# Patient Record
Sex: Female | Born: 1965 | Race: Black or African American | Hispanic: No | Marital: Single | Smoking: Never smoker
Health system: Southern US, Community
[De-identification: ages and names within clinical notes are randomized; demographics above are authoritative.]

## PROBLEM LIST (undated history)

## (undated) DIAGNOSIS — I10 Essential (primary) hypertension: Secondary | ICD-10-CM

## (undated) DIAGNOSIS — E785 Hyperlipidemia, unspecified: Secondary | ICD-10-CM

## (undated) DIAGNOSIS — D571 Sickle-cell disease without crisis: Secondary | ICD-10-CM

## (undated) DIAGNOSIS — D573 Sickle-cell trait: Secondary | ICD-10-CM

## (undated) DIAGNOSIS — G43909 Migraine, unspecified, not intractable, without status migrainosus: Secondary | ICD-10-CM

## (undated) DIAGNOSIS — D219 Benign neoplasm of connective and other soft tissue, unspecified: Secondary | ICD-10-CM

## (undated) DIAGNOSIS — M069 Rheumatoid arthritis, unspecified: Secondary | ICD-10-CM

## (undated) DIAGNOSIS — J40 Bronchitis, not specified as acute or chronic: Secondary | ICD-10-CM

## (undated) HISTORY — PX: PARTIAL HYSTERECTOMY: SHX80

## (undated) HISTORY — DX: Hyperlipidemia, unspecified: E78.5

## (undated) HISTORY — DX: Benign neoplasm of connective and other soft tissue, unspecified: D21.9

## (undated) HISTORY — DX: Sickle-cell disease without crisis: D57.1

## (undated) HISTORY — PX: ABDOMINAL HYSTERECTOMY: SHX81

## (undated) HISTORY — PX: BUNIONECTOMY: SHX129

## (undated) HISTORY — DX: Bronchitis, not specified as acute or chronic: J40

---

## 2000-11-24 HISTORY — PX: PARTIAL HYSTERECTOMY: SHX80

## 2020-10-09 DIAGNOSIS — M0609 Rheumatoid arthritis without rheumatoid factor, multiple sites: Secondary | ICD-10-CM | POA: Insufficient documentation

## 2021-04-18 ENCOUNTER — Other Ambulatory Visit: Payer: Self-pay

## 2021-04-18 ENCOUNTER — Ambulatory Visit (INDEPENDENT_AMBULATORY_CARE_PROVIDER_SITE_OTHER): Payer: 59

## 2021-04-18 ENCOUNTER — Ambulatory Visit (HOSPITAL_COMMUNITY)
Admission: RE | Admit: 2021-04-18 | Discharge: 2021-04-18 | Disposition: A | Discharge status: Home / Self Care | Payer: 59 | Source: Ambulatory Visit | Attending: Physician Assistant | Admitting: Physician Assistant

## 2021-04-18 ENCOUNTER — Encounter (HOSPITAL_COMMUNITY): Payer: Self-pay

## 2021-04-18 VITALS — BP 114/73 | HR 101 | Temp 101.5°F | Resp 18

## 2021-04-18 DIAGNOSIS — J069 Acute upper respiratory infection, unspecified: Secondary | ICD-10-CM | POA: Diagnosis present

## 2021-04-18 DIAGNOSIS — R509 Fever, unspecified: Secondary | ICD-10-CM | POA: Diagnosis not present

## 2021-04-18 DIAGNOSIS — R059 Cough, unspecified: Secondary | ICD-10-CM | POA: Diagnosis present

## 2021-04-18 DIAGNOSIS — J9801 Acute bronchospasm: Secondary | ICD-10-CM | POA: Diagnosis not present

## 2021-04-18 HISTORY — DX: Migraine, unspecified, not intractable, without status migrainosus: G43.909

## 2021-04-18 HISTORY — DX: Rheumatoid arthritis, unspecified: M06.9

## 2021-04-18 HISTORY — DX: Sickle-cell trait: D57.3

## 2021-04-18 HISTORY — DX: Essential (primary) hypertension: I10

## 2021-04-18 LAB — CBC WITH DIFFERENTIAL/PLATELET
Abs Immature Granulocytes: 0.01 10*3/uL (ref 0.00–0.07)
Basophils Absolute: 0 10*3/uL (ref 0.0–0.1)
Basophils Relative: 0 %
Eosinophils Absolute: 0 10*3/uL (ref 0.0–0.5)
Eosinophils Relative: 0 %
HCT: 38.6 % (ref 36.0–46.0)
Hemoglobin: 12.8 g/dL (ref 12.0–15.0)
Immature Granulocytes: 0 %
Lymphocytes Relative: 19 %
Lymphs Abs: 1.3 10*3/uL (ref 0.7–4.0)
MCH: 28.9 pg (ref 26.0–34.0)
MCHC: 33.2 g/dL (ref 30.0–36.0)
MCV: 87.1 fL (ref 80.0–100.0)
Monocytes Absolute: 0.9 10*3/uL (ref 0.1–1.0)
Monocytes Relative: 13 %
Neutro Abs: 4.8 10*3/uL (ref 1.7–7.7)
Neutrophils Relative %: 68 %
Platelets: 228 10*3/uL (ref 150–400)
RBC: 4.43 MIL/uL (ref 3.87–5.11)
RDW: 12.4 % (ref 11.5–15.5)
WBC: 7.1 10*3/uL (ref 4.0–10.5)
nRBC: 0 % (ref 0.0–0.2)

## 2021-04-18 LAB — COMPREHENSIVE METABOLIC PANEL
ALT: 24 U/L (ref 0–44)
AST: 24 U/L (ref 15–41)
Albumin: 3.5 g/dL (ref 3.5–5.0)
Alkaline Phosphatase: 74 U/L (ref 38–126)
Anion gap: 8 (ref 5–15)
BUN: 6 mg/dL (ref 6–20)
CO2: 26 mmol/L (ref 22–32)
Calcium: 8.6 mg/dL — ABNORMAL LOW (ref 8.9–10.3)
Chloride: 101 mmol/L (ref 98–111)
Creatinine, Ser: 0.73 mg/dL (ref 0.44–1.00)
GFR, Estimated: >60 mL/min (ref >60)
Glucose, Bld: 88 mg/dL (ref 70–99)
Potassium: 3.8 mmol/L (ref 3.5–5.1)
Sodium: 135 mmol/L (ref 135–145)
Total Bilirubin: 0.7 mg/dL (ref 0.3–1.2)
Total Protein: 7.7 g/dL (ref 6.5–8.1)

## 2021-04-18 MED ORDER — ACETAMINOPHEN 325 MG PO TABS
ORAL_TABLET | ORAL | Status: AC
  Filled 2021-04-18: qty 3

## 2021-04-18 MED ORDER — ALBUTEROL SULFATE HFA 108 (90 BASE) MCG/ACT IN AERS: 1.0000 | INHALATION_SPRAY | Freq: Four times a day (QID) | RESPIRATORY_TRACT | 0 refills | Status: DC | PRN

## 2021-04-18 MED ORDER — ACETAMINOPHEN 325 MG PO TABS
975.0000 mg | ORAL_TABLET | Freq: Once | ORAL | Status: AC
  Administered 2021-04-18: 975 mg via ORAL

## 2021-04-18 MED ORDER — PROMETHAZINE-DM 6.25-15 MG/5ML PO SYRP: 5.0000 mL | ORAL_SOLUTION | Freq: Two times a day (BID) | ORAL | 0 refills | Status: DC | PRN

## 2021-04-18 NOTE — ED Triage Notes (Signed)
Patient presents to [facility] for evaluation after being seen yesterday at another [facility] and opting not to see the provider.  C/o 2 days of fever, headache, cough, nasal congestion, and diarrhea.  States she was negative for flu, still waiting for COVID, but concerned she might have pnuemonia developing

## 2021-04-18 NOTE — Discharge Instructions (Addendum)
You can use Promethazine DM twice daily for cough.  This will make you sleepy so do not drive or drink alcohol with it.  Use albuterol every 4-6 hours as needed for shortness of breath or coughing fits.  Continue with over-the-counter medications for additional symptom relief.  If anything worsens please return for reevaluation.

## 2021-04-18 NOTE — ED Provider Notes (Signed)
[facility]    CSN: [id number] Arrival date & time: 04/18/21  1239      History   Chief Complaint Chief Complaint  Patient presents with  . URI    HPI Kimberly Roberts is a 55 y.o. female.   Patient presents today with a 2-day history of fever.  Reports associated cough, nasal congestion, headache, body aches.  She denies any shortness of breath or chest pain.  She has tried Coricidin, Tylenol, ibuprofen without improvement of symptoms.  She was seen at local urgent care yesterday and had negative flu testing but is still waiting on COVID-19 test results.  She denies history of allergies.  She denies history of asthma or COPD.  Denies history of smoking.  She has not had COVID or flu vaccines.  She does have a history of rheumatoid arthritis and is currently taking Enbrel she was concerned about secondary infection due to immune modulating medications.  She has been having difficulty managing fever despite regular use of antipyretics.     Past Medical History:  Diagnosis Date  . Hypertension   . Migraines   . Rheumatoid arthritis (HCC)   . Sickle cell trait (HCC)     There are no problems to display for this patient.   Past Surgical History:  Procedure Laterality Date  . PARTIAL HYSTERECTOMY      OB History   No obstetric history on file.      Home Medications    Prior to Admission medications   Medication Sig Start Date End Date Taking? Authorizing Provider  albuterol (VENTOLIN HFA) 108 (90 Base) MCG/ACT inhaler Inhale 1-2 puffs into the lungs every 6 (six) hours as needed for wheezing or shortness of breath. 04/18/21  Yes Denine Brotz K, PA-C  amLODipine (NORVASC) 5 MG tablet Take 5 mg by mouth daily.   Yes [provider]  promethazine-dextromethorphan (PROMETHAZINE-DM) 6.25-15 MG/5ML syrup Take 5 mLs by mouth 2 (two) times daily as needed for cough. 04/18/21  Yes Nare Gaspari, Noberto Retort, PA-C    Family History Family History  Problem Relation  Age of Onset  . Diabetes Mother   . Sarcoidosis Father   . Prostate cancer Father     Social History Social History   Tobacco Use  . Smoking status: Never Smoker  . Smokeless tobacco: Never Used  Substance Use Topics  . Alcohol use: Not Currently  . Drug use: Never     Allergies   Doxycycline and Sulfa antibiotics   Review of Systems Review of Systems  Constitutional: Positive for activity change, chills, fatigue and fever. Negative for appetite change.  HENT: Positive for congestion. Negative for sinus pressure, sneezing and sore throat.   Respiratory: Positive for cough and wheezing. Negative for shortness of breath.   Cardiovascular: Negative for chest pain.  Gastrointestinal: Negative for abdominal pain, diarrhea, nausea and vomiting.  Musculoskeletal: Positive for arthralgias and myalgias.  Neurological: Positive for headaches. Negative for dizziness and light-headedness.     Physical Exam Triage Vital Signs ED Triage Vitals  Enc Vitals Group     BP 04/18/21 1311 128/89     Pulse Rate 04/18/21 1311 97     Resp 04/18/21 1311 18     Temp 04/18/21 1311 (!) 102.8 F (39.3 C)     Temp Source 04/18/21 1311 Oral     SpO2 04/18/21 1311 97 %     Weight --      Height --      Head Circumference --  Peak Flow --      Pain Score 04/18/21 1312 3     Pain Loc --      Pain Edu? --      Excl. in GC? --    No data found.  Updated Vital Signs BP 114/73 (BP Location: Left Arm)   Pulse (!) 101   Temp (!) 101.5 F (38.6 C) (Oral)   Resp 18   SpO2 96%   Visual Acuity Right Eye Distance:   Left Eye Distance:   Bilateral Distance:    Right Eye Near:   Left Eye Near:    Bilateral Near:     Physical Exam Constitutional:      General: She is awake. She is not in acute distress.    Appearance: Normal appearance. She is not ill-appearing.     Comments: Very pleasant female appears stated age in no acute distress  HENT:     Head: Normocephalic and atraumatic.      Right Ear: Tympanic membrane, ear canal and external ear normal. Tympanic membrane is not erythematous or bulging.     Left Ear: Tympanic membrane, ear canal and external ear normal. Tympanic membrane is not erythematous or bulging.     Nose:     Right Sinus: No maxillary sinus tenderness or frontal sinus tenderness.     Left Sinus: No maxillary sinus tenderness or frontal sinus tenderness.     Mouth/Throat:     Pharynx: Uvula midline. No oropharyngeal exudate or posterior oropharyngeal erythema.  Cardiovascular:     Rate and Rhythm: Normal rate and regular rhythm.     Heart sounds: Normal heart sounds. No murmur heard.   Pulmonary:     Effort: Pulmonary effort is normal.     Breath sounds: Normal breath sounds. No wheezing, rhonchi or rales.     Comments: Reactive cough with deep breathing.  No adventitious lung sounds on exam. Musculoskeletal:     Right lower leg: No edema.     Left lower leg: No edema.  Lymphadenopathy:     Head:     Right side of head: No submental, submandibular or tonsillar adenopathy.     Left side of head: No submental, submandibular or tonsillar adenopathy.     Cervical: No cervical adenopathy.  Psychiatric:        Behavior: Behavior is cooperative.      UC Treatments / Results  Labs (all labs ordered are listed, but only abnormal results are displayed) Labs Reviewed  CBC WITH DIFFERENTIAL/PLATELET  COMPREHENSIVE METABOLIC PANEL    EKG   Radiology DG Chest 2 View  Result Date: 04/18/2021 CLINICAL DATA:  Cough and fever. EXAM: CHEST - 2 VIEW COMPARISON:  None. FINDINGS: The heart size and mediastinal contours are within normal limits. Both lungs are clear. The visualized skeletal structures are unremarkable. IMPRESSION: No active cardiopulmonary disease. Electronically Signed   By: Danae Orleans M.D.   On: 04/18/2021 14:11    Procedures Procedures (including critical care time)  Medications Ordered in UC Medications  acetaminophen  (TYLENOL) tablet 975 mg (975 mg Oral Given 04/18/21 1348)    Initial Impression / Assessment and Plan / UC Course  I have reviewed the triage vital signs and the nursing notes.  Pertinent labs & imaging results that were available during my care of the patient were reviewed by me and considered in my medical decision making (see chart for details).     Patient had negative flu test at different urgent care.  She is awaiting COVID-19 results and declined additional testing today.  Given high fever and that patient is taking medication that impact her immune system x-ray was obtained that was normal.  CBC and CMP were obtained today.  She was prescribed albuterol with instruction on how to properly use this medication during visit today.  She was given Promethazine DM for cough symptoms with instruction not to drive or drink alcohol with this medication as drowsiness is a common side effect.  Discussed alarm symptoms that warrant emergent evaluation.  She was encouraged to use over-the-counter medications for additional symptom relief.  Strict return precautions given to which patient expressed understanding.  Final Clinical Impressions(s) / UC Diagnoses   Final diagnoses:  Upper respiratory tract infection, unspecified type  Cough  Bronchospasm     Discharge Instructions     You can use Promethazine DM twice daily for cough.  This will make you sleepy so do not drive or drink alcohol with it.  Use albuterol every 4-6 hours as needed for shortness of breath or coughing fits.  Continue with over-the-counter medications for additional symptom relief.  If anything worsens please return for reevaluation.    ED Prescriptions    Medication Sig Dispense Auth. Provider   albuterol (VENTOLIN HFA) 108 (90 Base) MCG/ACT inhaler Inhale 1-2 puffs into the lungs every 6 (six) hours as needed for wheezing or shortness of breath. 8 g Editha Bridgeforth K, PA-C   promethazine-dextromethorphan (PROMETHAZINE-DM)  6.25-15 MG/5ML syrup Take 5 mLs by mouth 2 (two) times daily as needed for cough. 118 mL Cyniah Gossard K, PA-C     PDMP not reviewed this encounter.   Jeani Hawking, PA-C 04/18/21 1437

## 2021-07-16 NOTE — Progress Notes (Signed)
Office Visit Note  Patient: Kimberly Roberts             Date of Birth: 20-Oct-1966           MRN: [id number]             PCP: Pcp, No Referring: Allegra Lai, MD Visit Date: 07/17/2021 Occupation: Laboratory technician  Subjective:  New Patient (Initial Visit) (Patient is here for treatment of RA. Patient has recently been on Ebrel, but has been out for approximately 2 months and noticed increased symptoms since.)   History of Present Illness: Kimberly Roberts is a 55 y.o. female here for rheumatoid arthritis.  She was originally diagnosed in [place] in 2016 after development of bilateral palmar erythematous rashes with joint pain and swelling in her wrists and proximal finger joints.  This was initially treated with steroids and then went on subcutaneous methotrexate she took this for about 6 months as monotherapy with a fair amount of improvement though tolerated the medicine poorly with persistent fatigue nausea and feeling sick continuously.  Addition of Enbrel resulted in complete symptom improvement and she subsequently came off the methotrexate did extremely well on Enbrel monotherapy for the past 2 years.  She moved from [place] to this area around December for a new job position and to be closer with family and needs to reestablish rheumatology care.  She has run out of the medicine and is off Enbrel since about 2 months ago.  She is starting to notice increasing symptoms although still mild in the left wrist and in the right foot but without obvious swelling, and also noticing a significant increase in generalized fatigue.  DMARD Hx Enbrel 2017- current Methotrexate 2016-2019  Labs reviewed 2016 ANA 1:640 Nuclear dots RNP, SSA, SSB, Smith, dsDNA, centromere, Scl-7, ACA neg RF neg CCP neg HLA-B27 neg G6PD 8.6 (9.9-16.6) Uric acid 3.5  Imaging reviewed (reports) 2016 CXR no active airspace disease 2016 Bilateral hand xrays without erosive disease     Activities of Daily Living:  Patient reports morning stiffness for 30-60 minutes.   Patient Denies nocturnal pain.  Difficulty dressing/grooming: Denies Difficulty climbing stairs: Denies Difficulty getting out of chair: Denies Difficulty using hands for taps, buttons, cutlery, and/or writing: Reports  Review of Systems  Constitutional:  Positive for fatigue.  HENT:  Negative for mouth sores, mouth dryness and nose dryness.   Eyes:  Negative for pain, itching, visual disturbance and dryness.  Respiratory:  Negative for cough, hemoptysis, shortness of breath and difficulty breathing.   Cardiovascular:  Negative for chest pain, palpitations and swelling in legs/feet.  Gastrointestinal:  Positive for constipation. Negative for abdominal pain, blood in stool and diarrhea.  Endocrine: Negative for increased urination.  Genitourinary:  Negative for painful urination.  Musculoskeletal:  Positive for joint pain, joint pain, muscle weakness and morning stiffness. Negative for joint swelling, myalgias, muscle tenderness and myalgias.  Skin:  Negative for color change, rash and redness.  Allergic/Immunologic: Negative for susceptible to infections.  Neurological:  Positive for weakness. Negative for dizziness, numbness, headaches and memory loss.  Hematological:  Negative for swollen glands.  Psychiatric/Behavioral:  Negative for confusion and sleep disturbance.    PMFS History:  Patient Active Problem List   Diagnosis Date Noted   High risk medication use 07/17/2021   Rheumatoid arthritis of multiple sites with negative rheumatoid factor ([place]) 10/09/2020    Past Medical History:  Diagnosis Date   Hypertension    Migraines    Rheumatoid  arthritis ([place])    Sickle cell trait ([place])     Family History  Problem Relation Age of Onset   Diabetes Mother    Hypertension Mother    Sarcoidosis Father    Prostate cancer Father    Hypertension Sister    Migraines Sister    Sickle cell trait  Sister    Migraines Brother    Past Surgical History:  Procedure Laterality Date   BUNIONECTOMY Right    PARTIAL HYSTERECTOMY     Social History   Social History Narrative   Not on file   Immunization History  Administered Date(s) Administered   Tdap 08/23/2018   Zoster, Live 08/05/2017, 12/23/2017     Objective: Vital Signs: BP 132/84 (BP Location: Right Arm, Patient Position: Sitting, Cuff Size: Normal)   Pulse 65   Ht 5' 3"  (1.6 m)   Wt 150 lb (68 kg)   BMI 26.57 kg/m    Physical Exam HENT:     Right Ear: External ear normal.     Left Ear: External ear normal.     Mouth/Throat:     Mouth: Mucous membranes are moist.     Pharynx: Oropharynx is clear.  Eyes:     Conjunctiva/sclera: Conjunctivae normal.  Cardiovascular:     Rate and Rhythm: Normal rate and regular rhythm.  Pulmonary:     Effort: Pulmonary effort is normal.     Breath sounds: Normal breath sounds.  Skin:    General: Skin is warm and dry.     Findings: No rash.  Neurological:     General: No focal deficit present.     Mental Status: She is alert.     Deep Tendon Reflexes: Reflexes normal.  Psychiatric:        Mood and Affect: Mood normal.     Musculoskeletal Exam:  Neck full ROM no tenderness Shoulders full ROM no tenderness or swelling Elbows full ROM no tenderness or swelling Wrists full ROM, mild left wrist tenderness, no palpable synovitis Fingers full ROM no tenderness or swelling Hip normal internal and external rotation without pain, no tenderness to lateral hip palpation Knees full ROM no tenderness or swelling Ankles full ROM no tenderness or swelling Foot mild lateral and midfoot soreness no focal tenderness reproducible on exam   CDAI Exam: CDAI Score: 5  Patient Global: 30 mm; Provider Global: 10 mm Swollen: 0 ; Tender: 1  Joint Exam 07/17/2021      Right  Left  Wrist      Tender     Investigation: No additional findings.  Imaging: No results found.  Recent  Labs: Lab Results  Component Value Date   WBC 7.1 04/18/2021   HGB 12.8 04/18/2021   PLT 228 04/18/2021   NA 135 04/18/2021   K 3.8 04/18/2021   CL 101 04/18/2021   CO2 26 04/18/2021   GLUCOSE 88 04/18/2021   BUN 6 04/18/2021   CREATININE 0.73 04/18/2021   BILITOT 0.7 04/18/2021   ALKPHOS 74 04/18/2021   AST 24 04/18/2021   ALT 24 04/18/2021   PROT 7.7 04/18/2021   ALBUMIN 3.5 04/18/2021   CALCIUM 8.6 (L) 04/18/2021    Speciality Comments: No specialty comments available.  Procedures:  No procedures performed Allergies: Doxycycline and Sulfa antibiotics   Assessment / Plan:     Visit Diagnoses: Rheumatoid arthritis of multiple sites with negative rheumatoid factor ([place]) - Plan: XR Hand 2 View Right, XR Hand 2 View Left, XR Foot 2 Views Left,  XR Foot 2 Views Right, Rheumatoid factor, Cyclic citrul peptide antibody, IgG, ANA, Sedimentation rate, C-reactive protein  She reports previous excellent disease control on Enbrel monotherapy with no side effects on a few years of treatment.  Her previous work-up a baseline screening was completed and without problems.  We will plan to recheck x-rays of bilateral hands and feet for any evidence of erosion or other disease progression compared to at time of diagnosis.  We will repeat rheumatoid factor and CCP and ANA test.  Checking sedimentation rate and CRP for any serology signs of disease activity.  We will plan to resume her maintenance treatment of Enbrel 50 mg subcu weekly unless unexpected lab abnormalities are seen.  She may be a candidate for some dose tapering if stable after resuming treatment.  High risk medication use - Plan: QuantiFERON-TB Gold Plus, IgG, IgA, IgM, CBC with Differential/Platelet, COMPLETE METABOLIC PANEL WITH GFR  TNF inhibitor treatment requires routine long-term monitoring for cytopenias, hepatotoxicity, or screening for latent tuberculosis.  Checking labs today also baseline immunoglobulins now while she is  off treatment.  Orders: Orders Placed This Encounter  Procedures   XR Hand 2 View Right   XR Hand 2 View Left   XR Foot 2 Views Left   XR Foot 2 Views Right   Rheumatoid factor   Cyclic citrul peptide antibody, IgG   ANA   Sedimentation rate   C-reactive protein   QuantiFERON-TB Gold Plus   IgG, IgA, IgM   CBC with Differential/Platelet   COMPLETE METABOLIC PANEL WITH GFR   No orders of the defined types were placed in this encounter.    Follow-Up Instructions: Return in about 2 months (around 09/16/2021).   Collier Salina, MD  Note - This record has been created using [employer].  Chart creation errors have been sought, but may not always  have been located. Such creation errors do not reflect on  the standard of medical care.

## 2021-07-17 ENCOUNTER — Ambulatory Visit: Payer: 59 | Admitting: Internal Medicine

## 2021-07-17 ENCOUNTER — Ambulatory Visit: Payer: Self-pay

## 2021-07-17 ENCOUNTER — Encounter: Payer: Self-pay | Admitting: Internal Medicine

## 2021-07-17 ENCOUNTER — Other Ambulatory Visit: Payer: Self-pay

## 2021-07-17 VITALS — BP 132/84 | HR 65 | Ht 63.0 in | Wt 150.0 lb

## 2021-07-17 DIAGNOSIS — M79642 Pain in left hand: Secondary | ICD-10-CM

## 2021-07-17 DIAGNOSIS — Z79899 Other long term (current) drug therapy: Secondary | ICD-10-CM | POA: Diagnosis not present

## 2021-07-17 DIAGNOSIS — M0609 Rheumatoid arthritis without rheumatoid factor, multiple sites: Secondary | ICD-10-CM

## 2021-07-17 DIAGNOSIS — M79671 Pain in right foot: Secondary | ICD-10-CM

## 2021-07-17 DIAGNOSIS — M79672 Pain in left foot: Secondary | ICD-10-CM

## 2021-07-17 DIAGNOSIS — M79641 Pain in right hand: Secondary | ICD-10-CM

## 2021-07-20 LAB — COMPLETE METABOLIC PANEL WITH GFR
AG Ratio: 1.1 (calc) (ref 1.0–2.5)
ALT: 10 U/L (ref 6–29)
AST: 12 U/L (ref 10–35)
Albumin: 4 g/dL (ref 3.6–5.1)
Alkaline phosphatase (APISO): 85 U/L (ref 37–153)
BUN: 10 mg/dL (ref 7–25)
CO2: 29 mmol/L (ref 20–32)
Calcium: 9.4 mg/dL (ref 8.6–10.4)
Chloride: 106 mmol/L (ref 98–110)
Creat: 0.67 mg/dL (ref 0.50–1.03)
Globulin: 3.5 g/dL (calc) (ref 1.9–3.7)
Glucose, Bld: 73 mg/dL (ref 65–99)
Potassium: 4.2 mmol/L (ref 3.5–5.3)
Sodium: 141 mmol/L (ref 135–146)
Total Bilirubin: 0.4 mg/dL (ref 0.2–1.2)
Total Protein: 7.5 g/dL (ref 6.1–8.1)
eGFR: 104 mL/min/{1.73_m2} (ref >=60)

## 2021-07-20 LAB — CBC WITH DIFFERENTIAL/PLATELET
Absolute Monocytes: 306 cells/uL (ref 200–950)
Basophils Absolute: 32 cells/uL (ref 0–200)
Basophils Relative: 0.7 %
Eosinophils Absolute: 180 cells/uL (ref 15–500)
Eosinophils Relative: 4 %
HCT: 39.2 % (ref 35.0–45.0)
Hemoglobin: 12.6 g/dL (ref 11.7–15.5)
Lymphs Abs: 1764 cells/uL (ref 850–3900)
MCH: 28.3 pg (ref 27.0–33.0)
MCHC: 32.1 g/dL (ref 32.0–36.0)
MCV: 88.1 fL (ref 80.0–100.0)
MPV: 11.2 fL (ref 7.5–12.5)
Monocytes Relative: 6.8 %
Neutro Abs: 2219 cells/uL (ref 1500–7800)
Neutrophils Relative %: 49.3 %
Platelets: 284 10*3/uL (ref 140–400)
RBC: 4.45 10*6/uL (ref 3.80–5.10)
RDW: 12.7 % (ref 11.0–15.0)
Total Lymphocyte: 39.2 %
WBC: 4.5 10*3/uL (ref 3.8–10.8)

## 2021-07-20 LAB — ANTI-NUCLEAR AB-TITER (ANA TITER)
ANA TITER: 1:40 {titer} — ABNORMAL HIGH
ANA Titer 1: 1:1280 {titer} — ABNORMAL HIGH

## 2021-07-20 LAB — IGG, IGA, IGM
IgG (Immunoglobin G), Serum: 2029 mg/dL — ABNORMAL HIGH (ref 600–1640)
IgM, Serum: 142 mg/dL (ref 50–300)
Immunoglobulin A: 336 mg/dL — ABNORMAL HIGH (ref 47–310)

## 2021-07-20 LAB — RHEUMATOID FACTOR: Rheumatoid fact SerPl-aCnc: <14 IU/mL (ref <14)

## 2021-07-20 LAB — QUANTIFERON-TB GOLD PLUS
Mitogen-NIL: >10 IU/mL
NIL: 0.02 IU/mL
QuantiFERON-TB Gold Plus: NEGATIVE
TB1-NIL: 0 IU/mL
TB2-NIL: 0 IU/mL

## 2021-07-20 LAB — C-REACTIVE PROTEIN: CRP: 1.8 mg/L (ref <8.0)

## 2021-07-20 LAB — SEDIMENTATION RATE: Sed Rate: 17 mm/h (ref 0–30)

## 2021-07-20 LAB — CYCLIC CITRUL PEPTIDE ANTIBODY, IGG: Cyclic Citrullin Peptide Ab: <16 UNITS

## 2021-07-20 LAB — ANA: Anti Nuclear Antibody (ANA): POSITIVE — AB

## 2021-07-23 NOTE — Progress Notes (Signed)
Test results show negative RA antibodies this usually predicts better or less damaging disease and xrays showed no erosions. Inflammation markers were normal which is good. The ANA test is very positive so does look like there is definitely some ongoing autoimmune process though and we will continue current treatment plan.

## 2021-07-24 ENCOUNTER — Encounter: Payer: Self-pay | Admitting: Radiology

## 2021-07-25 ENCOUNTER — Other Ambulatory Visit: Payer: Self-pay | Admitting: Radiology

## 2021-07-25 DIAGNOSIS — M0609 Rheumatoid arthritis without rheumatoid factor, multiple sites: Secondary | ICD-10-CM

## 2021-07-25 DIAGNOSIS — Z79899 Other long term (current) drug therapy: Secondary | ICD-10-CM

## 2021-07-25 MED ORDER — ENBREL SURECLICK 50 MG/ML ~~LOC~~ SOAJ: 50.0000 mg | SUBCUTANEOUS | 5 refills | Status: DC

## 2021-07-25 NOTE — Telephone Encounter (Signed)
Spoke with patient, she needs a refill of Enbrel sureclick 50 mg/mL injection once weekly.   Please update prescription accordingly- patient uses [facility].

## 2021-09-17 NOTE — Progress Notes (Deleted)
 Office Visit Note  Patient: Kimberly Roberts             Date of Birth: 02/11/1966           MRN: [id number]             PCP: Pcp, No Referring: No ref. provider found Visit Date: 09/18/2021   Subjective:  No chief complaint on file.   History of Present Illness: [name] is a 55 y.o. female here for follow up for seronegative arthritis after restarting Enbrel 50 mg [place] weekly low disease activity at the initial visit ***   Previous HPI 07/17/21 Kimberly Roberts is a 55 y.o. female here for rheumatoid arthritis.  She was originally diagnosed in [place] in 2016 after development of bilateral palmar erythematous rashes with joint pain and swelling in her wrists and proximal finger joints.  This was initially treated with steroids and then went on subcutaneous methotrexate she took this for about 6 months as monotherapy with a fair amount of improvement though tolerated the medicine poorly with persistent fatigue nausea and feeling sick continuously.  Addition of Enbrel resulted in complete symptom improvement and she subsequently came off the methotrexate did extremely well on Enbrel monotherapy for the past 2 years.  She moved from [place] to this area around December for a new job position and to be closer with family and needs to reestablish rheumatology care.  She has run out of the medicine and is off Enbrel since about 2 months ago.  She is starting to notice increasing symptoms although still mild in the left wrist and in the right foot but without obvious swelling, and also noticing a significant increase in generalized fatigue.   DMARD Hx Enbrel 2017- current Methotrexate 2016-2019   Labs reviewed 2016 ANA 1:640 Nuclear dots RNP, SSA, SSB, Smith, dsDNA, centromere, Scl-7, ACA neg RF neg CCP neg HLA-B27 neg G6PD 8.6 (9.9-16.6) Uric acid 3.5   Imaging reviewed (reports) 2016 CXR no active airspace disease 2016 Bilateral hand xrays without erosive  disease   No Rheumatology ROS completed.   PMFS History:  Patient Active Problem List   Diagnosis Date Noted   High risk medication use 07/17/2021   Rheumatoid arthritis of multiple sites with negative rheumatoid factor (HCC) 10/09/2020    Past Medical History:  Diagnosis Date   Hypertension    Migraines    Rheumatoid arthritis (HCC)    Sickle cell trait (HCC)     Family History  Problem Relation Age of Onset   Diabetes Mother    Hypertension Mother    Sarcoidosis Father    Prostate cancer Father    Hypertension Sister    Migraines Sister    Sickle cell trait Sister    Migraines Brother    Past Surgical History:  Procedure Laterality Date   BUNIONECTOMY Right    PARTIAL HYSTERECTOMY     Social History   Social History Narrative   Not on file   Immunization History  Administered Date(s) Administered   Tdap 08/23/2018   Zoster, Live 08/05/2017, 12/23/2017     Objective: Vital Signs: There were no vitals taken for this visit.   Physical Exam   Musculoskeletal Exam: ***  CDAI Exam: CDAI Score: -- Patient Global: --; Provider Global: -- Swollen: --; Tender: -- Joint Exam 09/18/2021   No joint exam has been documented for this visit   There is currently no information documented on the homunculus. Go to the Rheumatology activity and   complete the homunculus joint exam.  Investigation: No additional findings.  Imaging: No results found.  Recent Labs: Lab Results  Component Value Date   WBC 4.5 07/17/2021   HGB 12.6 07/17/2021   PLT 284 07/17/2021   NA 141 07/17/2021   K 4.2 07/17/2021   CL 106 07/17/2021   CO2 29 07/17/2021   GLUCOSE 73 07/17/2021   BUN 10 07/17/2021   CREATININE 0.67 07/17/2021   BILITOT 0.4 07/17/2021   ALKPHOS 74 04/18/2021   AST 12 07/17/2021   ALT 10 07/17/2021   PROT 7.5 07/17/2021   ALBUMIN 3.5 04/18/2021   CALCIUM 9.4 07/17/2021   QFTBGOLDPLUS NEGATIVE 07/17/2021    Speciality Comments: No specialty comments  available.  Procedures:  No procedures performed Allergies: Doxycycline and Sulfa antibiotics   Assessment / Plan:     Visit Diagnoses: No diagnosis found.  ***  Orders: No orders of the defined types were placed in this encounter.  No orders of the defined types were placed in this encounter.    Follow-Up Instructions: No follow-ups on file.   Collier Salina, MD  Note - This record has been created using [employer].  Chart creation errors have been sought, but may not always  have been located. Such creation errors do not reflect on  the standard of medical care.

## 2021-09-18 ENCOUNTER — Ambulatory Visit: Payer: 59 | Admitting: Internal Medicine

## 2021-12-20 ENCOUNTER — Telehealth: Payer: Self-pay | Admitting: Pharmacist

## 2021-12-20 NOTE — Telephone Encounter (Signed)
Received notification from [facility] regarding a prior authorization for ENBREL. Authorization has been APPROVED from 12/20/21 to 12/20/22.   Patient can continue to fill through [facility]: [phone]   Authorization #  [id number] Phone # [phone]  Chesley Mires, PharmD, MPH, BCPS Clinical Pharmacist (Rheumatology and Pulmonology)

## 2021-12-20 NOTE — Telephone Encounter (Signed)
Submitted a Prior Authorization RENEWAL request to [facility] for ENBREL via CoverMyMeds. Will update once we receive a response.  Key: Delfin Gant, PharmD, MPH, BCPS Clinical Pharmacist (Rheumatology and Pulmonology)

## 2022-01-11 ENCOUNTER — Other Ambulatory Visit: Payer: Self-pay | Admitting: Internal Medicine

## 2022-01-11 DIAGNOSIS — M0609 Rheumatoid arthritis without rheumatoid factor, multiple sites: Secondary | ICD-10-CM

## 2022-01-11 DIAGNOSIS — Z79899 Other long term (current) drug therapy: Secondary | ICD-10-CM

## 2022-01-13 NOTE — Telephone Encounter (Signed)
Attempted to contact the patient and left message for patient to call the office and schedule a follow up visit.  °

## 2022-01-13 NOTE — Telephone Encounter (Signed)
Ms. Kimberly Roberts needs to schedule a follow up appointment her last visit was August she is on enbrel. Okay to refill now if she schedules an appointment to avoid treatment interruption.

## 2022-01-14 NOTE — Telephone Encounter (Signed)
Thanks for reaching out, hopefully she contacts [place]. We really will need to see her if we are going to resume her medication.

## 2022-01-14 NOTE — Telephone Encounter (Signed)
Attempted to contact the patient and left message for patient to call the office and schedule a follow up visit.

## 2022-01-30 NOTE — Progress Notes (Signed)
? ?Office Visit Note ? ?Patient: Kimberly Roberts             ?Date of Birth: 18-Apr-1966           ?MRN: [id number]             ?PCP: Pcp, No ?Referring: No ref. provider found ?Visit Date: 01/31/2022 ? ? ?Subjective:  ? ?History of Present Illness: Kimberly Roberts is a 56 y.o. female here for follow up for seronegative RA on Enbrel 50 mg [place] weekly. She feels symptoms are well controlled joint stiffness in the mornings improves usually within about 15 minutes or less or with taking a hot shower. She still has intermittent fatigue not excessively sleepy or falling asleep during the day. She recalls this symptom since years ago it was slightly less when on both enbrel and methotrexate but she thinks it is less of a problem than the methotrexate side effects. No interval infections or major medical changes. ? ?Previous HPI ?07/17/21 ?Kimberly Roberts is a 56 y.o. female here for rheumatoid arthritis.  She was originally diagnosed in [place] in 2016 after development of bilateral palmar erythematous rashes with joint pain and swelling in her wrists and proximal finger joints. This was initially treated with steroids and then went on subcutaneous methotrexate she took this for about 6 months as monotherapy with a fair amount of improvement though tolerated the medicine poorly with persistent fatigue nausea and feeling sick continuously.  Addition of Enbrel resulted in complete symptom improvement and she subsequently came off the methotrexate did extremely well on Enbrel monotherapy for the past 2 years.  She moved from [place] to this area around December for a new job position and to be closer with family and needs to reestablish rheumatology care.  She has run out of the medicine and is off Enbrel since about 2 months ago.  She is starting to notice increasing symptoms although still mild in the left wrist and in the right foot but without obvious swelling, and also noticing a significant increase in  generalized fatigue. ?  ?DMARD Hx ?Enbrel 2017- current ?Methotrexate 2016-2019 ?  ?Labs reviewed ?2016 ?ANA 1:640 Nuclear dots ?RNP, SSA, SSB, Smith, dsDNA, centromere, Scl-7, ACA neg ?RF neg ?CCP neg ?HLA-B27 neg ?G6PD 8.6 (9.9-16.6) ?Uric acid 3.5 ?  ?Imaging reviewed (reports) ?2016 CXR no active airspace disease ?2016 Bilateral hand xrays without erosive disease ? ? ?Review of Systems  ?Constitutional:  Positive for fatigue.  ?HENT:  Negative for mouth dryness.   ?Eyes:  Negative for dryness.  ?Respiratory:  Negative for shortness of breath.   ?Cardiovascular:  Negative for swelling in legs/feet.  ?Gastrointestinal:  Negative for constipation.  ?Endocrine: Positive for heat intolerance.  ?Genitourinary:  Negative for difficulty urinating.  ?Musculoskeletal:  Positive for joint swelling and morning stiffness.  ?Skin:  Negative for rash.  ?Allergic/Immunologic: Negative for susceptible to infections.  ?Neurological:  Negative for numbness.  ?Hematological:  Negative for bruising/bleeding tendency.  ?Psychiatric/Behavioral:  Negative for sleep disturbance.   ? ?PMFS History:  ?Patient Active Problem List  ? Diagnosis Date Noted  ? High risk medication use 07/17/2021  ? Rheumatoid arthritis of multiple sites with negative rheumatoid factor ([place]) 10/09/2020  ?  ?Past Medical History:  ?Diagnosis Date  ? Hypertension   ? Migraines   ? Rheumatoid arthritis ([place])   ? Sickle cell trait ([place])   ?  ?Family History  ?Problem Relation Age of Onset  ? Diabetes Mother   ?  Hypertension Mother   ? Sarcoidosis Father   ? Prostate cancer Father   ? Hypertension Sister   ? Migraines Sister   ? Sickle cell trait Sister   ? Migraines Brother   ? ?Past Surgical History:  ?Procedure Laterality Date  ? BUNIONECTOMY Right   ? PARTIAL HYSTERECTOMY    ? ?Social History  ? ?Social History Narrative  ? Not on file  ? ?Immunization History  ?Administered Date(s) Administered  ? Tdap 08/23/2018  ? Zoster, Live 08/05/2017, 12/23/2017  ?   ? ?Objective: ?Vital Signs: BP (!) 152/94 (BP Location: Left Arm, Patient Position: Sitting, Cuff Size: Normal)   Pulse 73   Resp 15   Ht 5' 2.5" (1.588 m)   Wt 166 lb (75.3 kg)   BMI 29.88 kg/m?   ? ?Physical Exam ?Cardiovascular:  ?   Rate and Rhythm: Normal rate and regular rhythm.  ?Pulmonary:  ?   Effort: Pulmonary effort is normal.  ?   Breath sounds: Normal breath sounds.  ?Musculoskeletal:  ?   Right lower leg: No edema.  ?   Left lower leg: No edema.  ?Skin: ?   General: Skin is warm and dry.  ?   Findings: No rash.  ?   Comments: Flat patch of nonblanching hyperpigmented skin on right upper arm  ?Neurological:  ?   Mental Status: She is alert.  ?   Deep Tendon Reflexes: Reflexes normal.  ?Psychiatric:     ?   Mood and Affect: Mood normal.  ?  ? ?Musculoskeletal Exam:  ?Shoulders full ROM no tenderness or swelling ?Elbows full ROM no tenderness or swelling ?Wrists full ROM no tenderness or swelling ?Fingers full ROM no tenderness or swelling ?Knees full ROM no tenderness or swelling, mild patellofemoral crepitus present ?Ankles full ROM no tenderness or swelling ?MTPs full ROM no tenderness or swelling ? ? ?CDAI Exam: ?CDAI Score: 2  ?Patient Global: 10 mm; Provider Global: 10 mm ?Swollen: 0 ; Tender: 0  ?Joint Exam 01/31/2022  ? ?All documented joints were normal  ? ? ? ?Investigation: ?No additional findings. ? ?Imaging: ?No results found. ? ?Recent Labs: ?Lab Results  ?Component Value Date  ? WBC 4.5 07/17/2021  ? HGB 12.6 07/17/2021  ? PLT 284 07/17/2021  ? NA 141 07/17/2021  ? K 4.2 07/17/2021  ? CL 106 07/17/2021  ? CO2 29 07/17/2021  ? GLUCOSE 73 07/17/2021  ? BUN 10 07/17/2021  ? CREATININE 0.67 07/17/2021  ? BILITOT 0.4 07/17/2021  ? ALKPHOS 74 04/18/2021  ? AST 12 07/17/2021  ? ALT 10 07/17/2021  ? PROT 7.5 07/17/2021  ? ALBUMIN 3.5 04/18/2021  ? CALCIUM 9.4 07/17/2021  ? QFTBGOLDPLUS NEGATIVE 07/17/2021  ? ? ?Speciality Comments: No specialty comments available. ? ?Procedures:  ?No  procedures performed ?Allergies: Doxycycline and Sulfa antibiotics  ? ?Assessment / Plan:     ?Visit Diagnoses: Rheumatoid arthritis of multiple sites with negative rheumatoid factor ([place]) - Plan: etanercept (ENBREL SURECLICK) 50 MG/ML injection, Sedimentation rate ? ?Symptoms are doing very well low activity or remission at this time. Still has fatigue. Rechecking sed rate for disease activity monitoring, suspect this to remain low. She had positive ANA but no new extraarticular symptoms, small skin change on right arm is chronic will just monitor for now. Plan is to continue Enbrel 50 mg [place] weekly. Stable condition and monotherapy can f/u in 19mo. ? ?High risk medication use - Plan: etanercept (ENBREL SURECLICK) 50 MG/ML injection, CBC with Differential/Platelet,  COMPLETE METABOLIC PANEL WITH GFR ? ?No new reactions or new infections. Checking CBC and CMP for Enbrel medication monitoring today. Last TB test reviewed negative 06/2021.  ? ?Orders: ?Orders Placed This Encounter  ?Procedures  ? Sedimentation rate  ? CBC with Differential/Platelet  ? COMPLETE METABOLIC PANEL WITH GFR  ? ?Meds ordered this encounter  ?Medications  ? etanercept (ENBREL SURECLICK) 50 MG/ML injection  ?  Sig: Inject 50 mg into the skin once a week.  ?  Dispense:  4 mL  ?  Refill:  5  ? ? ? ?Follow-Up Instructions: Return in about 6 months (around 08/03/2022) for RA on ENB f/u 74mo. ? ? ?CCollier Salina MD ? ?Note - This record has been created using [employer]  ?Chart creation errors have been sought, but may not always  ?have been located. Such creation errors do not reflect on  ?the standard of medical care. ? ?

## 2022-01-31 ENCOUNTER — Other Ambulatory Visit: Payer: Self-pay

## 2022-01-31 ENCOUNTER — Ambulatory Visit: Payer: 59 | Admitting: Internal Medicine

## 2022-01-31 ENCOUNTER — Encounter: Payer: Self-pay | Admitting: Internal Medicine

## 2022-01-31 VITALS — BP 152/94 | HR 73 | Resp 15 | Ht 62.5 in | Wt 166.0 lb

## 2022-01-31 DIAGNOSIS — M0609 Rheumatoid arthritis without rheumatoid factor, multiple sites: Secondary | ICD-10-CM

## 2022-01-31 DIAGNOSIS — Z79899 Other long term (current) drug therapy: Secondary | ICD-10-CM

## 2022-01-31 MED ORDER — ENBREL SURECLICK 50 MG/ML ~~LOC~~ SOAJ: 50.0000 mg | SUBCUTANEOUS | 5 refills | Status: DC

## 2022-02-01 LAB — CBC WITH DIFFERENTIAL/PLATELET
Absolute Monocytes: 339 cells/uL (ref 200–950)
Basophils Absolute: 39 cells/uL (ref 0–200)
Basophils Relative: 1 %
Eosinophils Absolute: 148 cells/uL (ref 15–500)
Eosinophils Relative: 3.8 %
HCT: 38.3 % (ref 35.0–45.0)
Hemoglobin: 12.5 g/dL (ref 11.7–15.5)
Lymphs Abs: 1400 cells/uL (ref 850–3900)
MCH: 28.5 pg (ref 27.0–33.0)
MCHC: 32.6 g/dL (ref 32.0–36.0)
MCV: 87.4 fL (ref 80.0–100.0)
MPV: 11.5 fL (ref 7.5–12.5)
Monocytes Relative: 8.7 %
Neutro Abs: 1973 cells/uL (ref 1500–7800)
Neutrophils Relative %: 50.6 %
Platelets: 259 10*3/uL (ref 140–400)
RBC: 4.38 10*6/uL (ref 3.80–5.10)
RDW: 12.8 % (ref 11.0–15.0)
Total Lymphocyte: 35.9 %
WBC: 3.9 10*3/uL (ref 3.8–10.8)

## 2022-02-01 LAB — COMPLETE METABOLIC PANEL WITH GFR
AG Ratio: 1.2 (calc) (ref 1.0–2.5)
ALT: 9 U/L (ref 6–29)
AST: 13 U/L (ref 10–35)
Albumin: 3.8 g/dL (ref 3.6–5.1)
Alkaline phosphatase (APISO): 82 U/L (ref 37–153)
BUN: 7 mg/dL (ref 7–25)
CO2: 28 mmol/L (ref 20–32)
Calcium: 8.9 mg/dL (ref 8.6–10.4)
Chloride: 107 mmol/L (ref 98–110)
Creat: 0.66 mg/dL (ref 0.50–1.03)
Globulin: 3.2 g/dL (calc) (ref 1.9–3.7)
Glucose, Bld: 85 mg/dL (ref 65–99)
Potassium: 4.2 mmol/L (ref 3.5–5.3)
Sodium: 141 mmol/L (ref 135–146)
Total Bilirubin: 0.4 mg/dL (ref 0.2–1.2)
Total Protein: 7 g/dL (ref 6.1–8.1)
eGFR: 104 mL/min/{1.73_m2} (ref >=60)

## 2022-02-01 LAB — SEDIMENTATION RATE: Sed Rate: 9 mm/h (ref 0–30)

## 2022-02-03 NOTE — Progress Notes (Signed)
Labs all look great, we can continue Enbrel without any changes at this time.

## 2022-02-10 ENCOUNTER — Ambulatory Visit
Admission: EM | Admit: 2022-02-10 | Discharge: 2022-02-10 | Disposition: A | Discharge status: Home / Self Care | Payer: 59 | Attending: Internal Medicine | Admitting: Internal Medicine

## 2022-02-10 ENCOUNTER — Encounter: Payer: Self-pay | Admitting: Emergency Medicine

## 2022-02-10 ENCOUNTER — Other Ambulatory Visit: Payer: Self-pay

## 2022-02-10 DIAGNOSIS — I10 Essential (primary) hypertension: Secondary | ICD-10-CM

## 2022-02-10 MED ORDER — AMLODIPINE BESYLATE 5 MG PO TABS: 5.0000 mg | ORAL_TABLET | Freq: Every day | ORAL | 0 refills | Status: DC

## 2022-02-10 NOTE — ED Triage Notes (Signed)
Moved recently. Out of BP meds. Headache, feels like her BP is high ?

## 2022-02-10 NOTE — ED Provider Notes (Signed)
?[facility] ? ? ? ?CSN: [id number] ?Arrival date & time: 02/10/22  0911 ? ? ?  ? ?History   ?Chief Complaint ?Chief Complaint  ?Patient presents with  ? Hypertension  ? Headache  ? ? ?HPI ?Kimberly Roberts is a 56 y.o. female.  ? ?Patient presents today due to concerns of high blood pressure.  Patient reports that she developed a headache yesterday, so she took her blood pressure last night and it was 170 systolic.  Patient reports that she typically takes amlodipine 5 mg daily but has been out of this medication for approximately 5 months.  She last had this medication refilled in September 2022 for 30-day supply by video visit.  Patient reports that she recently moved and has not been able to see a PCP since this but she has an appointment on April 25 with PCP to establish care.  Denies any associated dizziness, blurred vision, nausea, vomiting, chest pain, shortness of breath.  Headache is present near the left eye.  Patient has been taking amlodipine for approximately 5 years and has been tolerating well. ? ? ?Hypertension ? ?Headache ? ?Past Medical History:  ?Diagnosis Date  ? Hypertension   ? Migraines   ? Rheumatoid arthritis (HCC)   ? Sickle cell trait (HCC)   ? ? ?Patient Active Problem List  ? Diagnosis Date Noted  ? High risk medication use 07/17/2021  ? Rheumatoid arthritis of multiple sites with negative rheumatoid factor (HCC) 10/09/2020  ? ? ?Past Surgical History:  ?Procedure Laterality Date  ? BUNIONECTOMY Right   ? PARTIAL HYSTERECTOMY    ? ? ?OB History   ?No obstetric history on file. ?  ? ? ? ?Home Medications   ? ?Prior to Admission medications   ?Medication Sig Start Date End Date Taking? Authorizing Provider  ?albuterol (VENTOLIN HFA) 108 (90 Base) MCG/ACT inhaler Inhale 1-2 puffs into the lungs every 6 (six) hours as needed for wheezing or shortness of breath. ?Patient not taking: Reported on 07/17/2021 04/18/21   Raspet, Denny PeonErin K, PA-C  ?amLODipine (NORVASC) 5 MG tablet Take 1  tablet (5 mg total) by mouth daily. 02/10/22   Gustavus BryantMound, Hardin Hardenbrook E, FNP  ?Ascorbic Acid (VITAMIN C PO) Take by mouth daily.    [provider]  ?eletriptan (RELPAX) 40 MG tablet Take by mouth as needed.    [provider]  ?etanercept (ENBREL SURECLICK) 50 MG/ML injection Inject 50 mg into the skin once a week. 01/31/22   Fuller Planice, Christopher W, MD  ?promethazine-dextromethorphan (PROMETHAZINE-DM) 6.25-15 MG/5ML syrup Take 5 mLs by mouth 2 (two) times daily as needed for cough. ?Patient not taking: Reported on 07/17/2021 04/18/21   Raspet, Denny PeonErin K, PA-C  ?VITAMIN D PO Take by mouth daily.    [provider]  ? ? ?Family History ?Family History  ?Problem Relation Age of Onset  ? Diabetes Mother   ? Hypertension Mother   ? Sarcoidosis Father   ? Prostate cancer Father   ? Hypertension Sister   ? Migraines Sister   ? Sickle cell trait Sister   ? Migraines Brother   ? ? ?Social History ?Social History  ? ?Tobacco Use  ? Smoking status: Never  ? Smokeless tobacco: Never  ?Vaping Use  ? Vaping Use: Never used  ?Substance Use Topics  ? Alcohol use: Not Currently  ? Drug use: Never  ? ? ? ?Allergies   ?Doxycycline and Sulfa antibiotics ? ? ?Review of Systems ?Review of Systems ?Per HPI ? ?  Physical Exam ?Triage Vital Signs ?ED Triage Vitals [02/10/22 0931]  ?Enc Vitals Group  ?   BP (!) 175/109  ?   Pulse Rate 61  ?   Resp 16  ?   Temp 98.3 ?F (36.8 ?C)  ?   Temp Source Oral  ?   SpO2 98 %  ?   Weight   ?   Height   ?   Head Circumference   ?   Peak Flow   ?   Pain Score 2  ?   Pain Loc   ?   Pain Edu?   ?   Excl. in GC?   ? ?No data found. ? ?Updated Vital Signs ?BP (!) 175/109 (BP Location: Right Arm)   Pulse 61   Temp 98.3 ?F (36.8 ?C) (Oral)   Resp 16   SpO2 98%  ? ?Visual Acuity ?Right Eye Distance:   ?Left Eye Distance:   ?Bilateral Distance:   ? ?Right Eye Near:   ?Left Eye Near:    ?Bilateral Near:    ? ?Physical Exam ?Constitutional:   ?   General: She is not in acute distress. ?   Appearance:  Normal appearance. She is not toxic-appearing or diaphoretic.  ?HENT:  ?   Head: Normocephalic and atraumatic.  ?Eyes:  ?   Extraocular Movements: Extraocular movements intact.  ?   Conjunctiva/sclera: Conjunctivae normal.  ?   Pupils: Pupils are equal, round, and reactive to light.  ?Cardiovascular:  ?   Rate and Rhythm: Normal rate and regular rhythm.  ?   Pulses: Normal pulses.  ?   Heart sounds: Normal heart sounds.  ?Pulmonary:  ?   Effort: Pulmonary effort is normal. No respiratory distress.  ?   Breath sounds: Normal breath sounds.  ?Neurological:  ?   General: No focal deficit present.  ?   Mental Status: She is alert and oriented to person, place, and time. Mental status is at baseline.  ?   Cranial Nerves: Cranial nerves 2-12 are intact.  ?   Sensory: Sensation is intact.  ?   Motor: Motor function is intact.  ?   Coordination: Coordination is intact.  ?   Gait: Gait is intact.  ?Psychiatric:     ?   Mood and Affect: Mood normal.     ?   Behavior: Behavior normal.     ?   Thought Content: Thought content normal.     ?   Judgment: Judgment normal.  ? ? ? ?UC Treatments / Results  ?Labs ?(all labs ordered are listed, but only abnormal results are displayed) ?Labs Reviewed - No data to display ? ?EKG ? ? ?Radiology ?No results found. ? ?Procedures ?Procedures (including critical care time) ? ?Medications Ordered in UC ?Medications - No data to display ? ?Initial Impression / Assessment and Plan / UC Course  ?I have reviewed the triage vital signs and the nursing notes. ? ?Pertinent labs & imaging results that were available during my care of the patient were reviewed by me and considered in my medical decision making (see chart for details). ? ?  ? ?Due to high blood pressure with associated headache, patient was advised that it would be best for her to go to the hospital for further evaluation and management given hypertensive urgency.  Although, patient declined going to the hospital as she states that she  typically gets headaches when her blood pressure is elevated and this is normal for her.  Neuro  exam is normal and physical exam appears normal as well.  Risks associated with not going to hospital were discussed with patient.  Patient voiced understanding.  will refill amlodipine and blood pressure medication to help decrease patient's blood pressure.  Patient to follow-up with PCP as scheduled appointment on April 25.  Discussed strict return and ER precautions.  Patient verbalized understanding and was agreeable with plan. ?Final Clinical Impressions(s) / UC Diagnoses  ? ?Final diagnoses:  ?Essential hypertension  ? ? ? ?Discharge Instructions   ? ?  ?Your blood pressure medication has been refilled.  Please follow-up with primary care doctor for further evaluation and management. ? ? ? ?ED Prescriptions   ? ? Medication Sig Dispense Auth. Provider  ? amLODipine (NORVASC) 5 MG tablet Take 1 tablet (5 mg total) by mouth daily. 60 tablet Ervin Knack E, [place]  ? ?  ? ?PDMP not reviewed this encounter. ?  ?Gustavus Bryant, [place] ?02/10/22 1011 ? ?

## 2022-02-10 NOTE — Discharge Instructions (Addendum)
Your blood pressure medication has been refilled.  Please follow-up with primary care doctor for further evaluation and management. ?

## 2022-03-12 NOTE — Progress Notes (Signed)
?Subjective:  ? ? Kimberly Roberts - 56 y.o. female MRN [id number]  Date of birth: May 24, 1966 ? ?HPI ? ?Kimberly Roberts is to establish care.  ? ?Current issues and/or concerns: ?Urgent Care Follow-Up: ?02/10/2022 [facility] per NP note: ?Due to high blood pressure with associated headache, patient was advised that it would be best for her to go to the hospital for further evaluation and management given hypertensive urgency.  Although, patient declined going to the hospital as she states that she typically gets headaches when her blood pressure is elevated and this is normal for her.  Neuro exam is normal and physical exam appears normal as well.  Risks associated with not going to hospital were discussed with patient.  Patient voiced understanding.  will refill amlodipine and blood pressure medication to help decrease patient's blood pressure. Patient to follow-up with PCP as scheduled appointment on April 25.  Discussed strict return and ER precautions.  Patient verbalized understanding and was agreeable with plan. ? ?03/18/2022: ?Currently taking: see medication list  ?Med Adherence: [x]  Yes    []  No ?Medication side effects: []  Yes    [x]  No ?Adherence with salt restriction (low-salt diet): []  Yes  [x]  No ?Exercise: Yes [x]  No []  ?Home Monitoring?: [x]  Yes    []  No ?Monitoring Frequency: [x]  Yes    []  No ?Home BP results range: [x]  Yes    []  No ?Smoking []  Yes [x]  No ?SOB? []  Yes    [x]  No ?Chest Pain?: []  Yes    [x]  No ? ?2. Weight Management: ?Reports she has balanced diet and exercising. Reports seems cant lose 20 pounds she would like. Goal of getting back to 140 pounds. Has not tried any medications as of present but would like to try course. Not interested in referral to nutritionist as thinks this may be offered through her employer.  ? ? ?ROS per HPI  ? ? ?Health Maintenance: ?Health Maintenance Due  ?Topic Date Due  ? HIV Screening  Never done  ? Hepatitis C  Screening  Never done  ? Zoster Vaccines- Shingrix (1 of 2) Never done  ? PAP SMEAR-Modifier  Never done  ? COLONOSCOPY (Pts 45-71yrs Insurance coverage will need to be confirmed)  Never done  ? MAMMOGRAM  Never done  ? ? ? ?Past Medical History: ?Patient Active Problem List  ? Diagnosis Date Noted  ? High risk medication use 07/17/2021  ? Rheumatoid arthritis of multiple sites with negative rheumatoid factor (HCC) 10/09/2020  ? ? ?Social History  ? reports that she has never smoked. She has never used smokeless tobacco. She reports that she does not currently use alcohol. She reports that she does not use drugs.  ? ?Family History  ?family history includes Diabetes in her mother; Hypertension in her mother and sister; Migraines in her brother and sister; Prostate cancer in her father; Sarcoidosis in her father; Sickle cell trait in her sister.  ? ?Medications: reviewed and updated ?  ?Objective:  ? Physical Exam ?BP 131/87 (BP Location: Left Arm, Patient Position: Sitting, Cuff Size: Large)   Pulse 65   Temp 98.3 ?F (36.8 ?C)   Resp 16   Ht 5' 2.52" (1.588 m)   Wt 160 lb (72.6 kg)   SpO2 97%   BMI 28.78 kg/m?  ? ?Physical Exam ?HENT:  ?   Head: Normocephalic and atraumatic.  ?Eyes:  ?   Extraocular Movements: Extraocular movements intact.  ?  Conjunctiva/sclera: Conjunctivae normal.  ?   Pupils: Pupils are equal, round, and reactive to light.  ?Cardiovascular:  ?   Rate and Rhythm: Normal rate and regular rhythm.  ?   Pulses: Normal pulses.  ?   Heart sounds: Normal heart sounds.  ?Pulmonary:  ?   Effort: Pulmonary effort is normal.  ?   Breath sounds: Normal breath sounds.  ?Musculoskeletal:  ?   Cervical back: Normal range of motion and neck supple.  ?Neurological:  ?   General: No focal deficit present.  ?   Mental Status: She is alert and oriented to person, place, and time.  ?Psychiatric:     ?   Mood and Affect: Mood normal.     ?   Behavior: Behavior normal.  ? ? ?Assessment & Plan:  ?1. Encounter to  establish care: ?- Patient presents today to establish care.  ?- Return for annual physical examination, labs, and health maintenance. Arrive fasting meaning having no food for at least 8 hours prior to appointment. You may have only water or black coffee. Please take scheduled medications as normal. ? ?2. Essential (primary) hypertension: ?- Continue Amlodipine as prescribed. Counseled on medication adherence and adverse effects. ?- Counseled on blood pressure goal of less than 130/80, low-sodium, DASH diet, medication compliance, and 150 minutes of moderate intensity exercise per week as tolerated. Counseled on medication adherence and adverse effects. ?- Follow-up with primary provider in 4 months or sooner if needed.  ?- amLODipine (NORVASC) 5 MG tablet; Take 1 tablet (5 mg total) by mouth daily.  Dispense: 120 tablet; Refill: 0 ? ?3. Encounter for weight management: ?- Begin Semaglutide-Weight Management 0.25 mg weekly injection as prescribed. Patient provided with office sample. Correct injection administration demonstrated by CMA.  ?- Counseled on medication adherence and adverse effects. Patient verbalized understanding. ?- Counseled weight loss goal of 5% within 12 weeks or recommendation to discontinue regimen. Also, counseled on the importance of weight checks every 4 weeks. Patient verbalized understanding. ?- Follow-up with primary provider in 4 weeks or sooner if needed.  ? ? ? ? ?Patient was given clear instructions to go to Emergency Department or return to medical center if symptoms don't improve, worsen, or new problems develop.The patient verbalized understanding. ? ?I discussed the assessment and treatment plan with the patient. The patient was provided an opportunity to ask questions and all were answered. The patient agreed with the plan and demonstrated an understanding of the instructions. ?  ?The patient was advised to call back or seek an in-person evaluation if the symptoms worsen or if  the condition fails to improve as anticipated. ? ? ? ?[name], NP ?03/18/2022, 11:59 AM ?Primary Care at [facility]  ? ?

## 2022-03-18 ENCOUNTER — Ambulatory Visit (INDEPENDENT_AMBULATORY_CARE_PROVIDER_SITE_OTHER): Payer: 59 | Admitting: Family

## 2022-03-18 ENCOUNTER — Encounter: Payer: Self-pay | Admitting: Family

## 2022-03-18 VITALS — BP 131/87 | HR 65 | Temp 98.3°F | Resp 16 | Ht 62.52 in | Wt 160.0 lb

## 2022-03-18 DIAGNOSIS — Z7689 Persons encountering health services in other specified circumstances: Secondary | ICD-10-CM

## 2022-03-18 DIAGNOSIS — I10 Essential (primary) hypertension: Secondary | ICD-10-CM

## 2022-03-18 MED ORDER — AMLODIPINE BESYLATE 5 MG PO TABS: 5.0000 mg | ORAL_TABLET | Freq: Every day | ORAL | 0 refills | Status: DC

## 2022-03-18 NOTE — Patient Instructions (Signed)
Thank you for choosing Primary Care at [facility] for your medical home!   ? ?Kimberly Roberts was seen by Kimberly Fendt, NP today.  ? ?Kimberly Roberts's primary care provider is Kimberly Fendt, NP.   ?For the best care possible,  you should try to see Kimberly Stabs, NP ?whenever you come to office.  ? ?We look forward to seeing you again soon! ? ?If you have any questions about your visit today,  ?please call Kimberly Roberts at [phone] ? ?Or feel free to reach your provider via MyChart.    ?Semaglutide Injection (Weight Management) ?What is this medication? ?SEMAGLUTIDE (SEM a GLOO tide) promotes weight loss. It may also be used to maintain weight loss. It works by decreasing appetite. Changes to diet and exercise are often combined with this medication. ?This medicine may be used for other purposes; ask your health care provider or pharmacist if you have questions. ?COMMON BRAND NAME(S): Wegovy ?What should I tell my care team before I take this medication? ?They need to know if you have any of these conditions: ?Endocrine tumors (MEN 2) or if someone in your family had these tumors ?Eye disease, vision problems ?Gallbladder disease ?History of depression or mental health disease ?History of pancreatitis ?Kidney disease ?Stomach or intestine problems ?Suicidal thoughts, plans, or attempt; a previous suicide attempt by you or a family member ?Thyroid cancer or if someone in your family had thyroid cancer ?An unusual or allergic reaction to semaglutide, other medications, foods, dyes, or preservatives ?Pregnant or trying to get pregnant ?Breast-feeding ?How should I use this medication? ?This medication is injected under the skin. You will be taught how to prepare and give it. Take it as directed on the prescription label. It is given once every week (every 7 days). Keep taking it unless your care team tells you to stop. ?It is important that you put your used needles and pens in a special sharps container. Do  not put them in a trash can. If you do not have a sharps container, call your pharmacist or care team to get one. ?A special MedGuide will be given to you by the pharmacist with each prescription and refill. Be sure to read this information carefully each time. ?This medication comes with INSTRUCTIONS FOR USE. Ask your pharmacist for directions on how to use this medication. Read the information carefully. Talk to your pharmacist or care team if you have questions. ?Talk to your care team about the use of this medication in children. While it may be prescribed for children as young as 12 years for selected conditions, precautions do apply. ?Overdosage: If you think you have taken too much of this medicine contact a poison control center or emergency room at once. ?NOTE: This medicine is only for you. Do not share this medicine with others. ?What if I miss a dose? ?If you miss a dose and the next scheduled dose is more than 2 days away, take the missed dose as soon as possible. If you miss a dose and the next scheduled dose is less than 2 days away, do not take the missed dose. Take the next dose at your regular time. Do not take double or extra doses. If you miss your dose for 2 weeks or more, take the next dose at your regular time or call your care team to talk about how to restart this medication. ?What may interact with this medication? ?Insulin and other medications for diabetes ?This list may not  describe all possible interactions. Give your health care provider a list of all the medicines, herbs, non-prescription drugs, or dietary supplements you use. Also tell them if you smoke, drink alcohol, or use illegal drugs. Some items may interact with your medicine. ?What should I watch for while using this medication? ?Visit your care team for regular checks on your progress. It may be some time before you see the benefit from this medication. ?Drink plenty of fluids while taking this medication. Check with your  care team if you have severe diarrhea, nausea, and vomiting, or if you sweat a lot. The loss of too much body fluid may make it dangerous for you to take this medication. ?This medication may affect blood sugar levels. Ask your care team if changes in diet or medications are needed if you have diabetes. ?If you or your family notice any changes in your behavior, such as new or worsening depression, thoughts of harming yourself, anxiety, other unusual or disturbing thoughts, or memory loss, call your care team right away. ?Women should inform their care team if they wish to become pregnant or think they might be pregnant. Losing weight while pregnant is not advised and may cause harm to the unborn child. Talk to your care team for more information. ?What side effects may I notice from receiving this medication? ?Side effects that you should report to your care team as soon as possible: ?Allergic reactions--skin rash, itching, hives, swelling of the face, lips, tongue, or throat ?Change in vision ?Dehydration--increased thirst, dry mouth, feeling faint or lightheaded, headache, dark yellow or brown urine ?Gallbladder problems--severe stomach pain, nausea, vomiting, fever ?Heart palpitations--rapid, pounding, or irregular heartbeat ?Kidney injury--decrease in the amount of urine, swelling of the ankles, hands, or feet ?Pancreatitis--severe stomach pain that spreads to your back or gets worse after eating or when touched, fever, nausea, vomiting ?Thoughts of suicide or self-harm, worsening mood, feelings of depression ?Thyroid cancer--new mass or lump in the neck, pain or trouble swallowing, trouble breathing, hoarseness ?Side effects that usually do not require medical attention (report to your care team if they continue or are bothersome): ?Diarrhea ?Loss of appetite ?Nausea ?Stomach pain ?Vomiting ?This list may not describe all possible side effects. Call your doctor for medical advice about side effects. You may  report side effects to FDA at 1-800-FDA-1088. ?Where should I keep my medication? ?Keep out of the reach of children and pets. ?Refrigeration (preferred): Store in the refrigerator. Do not freeze. Keep this medication in the original container until you are ready to take it. Get rid of any unused medication after the expiration date. ?Room temperature: If needed, prior to cap removal, the pen can be stored at room temperature for up to 28 days. Protect from light. If it is stored at room temperature, get rid of any unused medication after 28 days or after it expires, whichever is first. ?It is important to get rid of the medication as soon as you no longer need it or it is expired. You can do this in two ways: ?Take the medication to a medication take-back program. Check with your pharmacy or law enforcement to find a location. ?If you cannot return the medication, follow the directions in the MedGuide. ?NOTE: This sheet is a summary. It may not cover all possible information. If you have questions about this medicine, talk to your doctor, pharmacist, or health care provider. ?? 2023 Elsevier/Gold Standard (2021-11-27 00:00:00) ? ?

## 2022-03-18 NOTE — Progress Notes (Signed)
Pt presents to establish care 

## 2022-04-10 NOTE — Progress Notes (Signed)
Erroneous encounter-disregard

## 2022-04-15 ENCOUNTER — Encounter: Payer: 59 | Admitting: Family

## 2022-04-15 DIAGNOSIS — Z7689 Persons encountering health services in other specified circumstances: Secondary | ICD-10-CM

## 2022-05-21 ENCOUNTER — Other Ambulatory Visit: Payer: Self-pay | Admitting: Family

## 2022-05-21 NOTE — Telephone Encounter (Signed)
Medication Refill - Medication:  eletriptan (RELPAX) 40 MG tablet       Sig - Route: Take by mouth as needed. - Oral   Class: Historical Med     Has the patient contacted their pharmacy? No. (Agent: If no, request that the patient contact the pharmacy for the refill. If patient does not wish to contact the pharmacy document the reason why and proceed with request.) Pt is New Pt from 4/25. This script never filled before takes as needed for migraines PT contact [phone] (Agent: If yes, when and what did the pharmacy advise?)  Preferred Pharmacy (with phone number or street name):  [id number] [place], [place] - [address]  [address] [place] [address]  Phone: [phone] Fax: [phone]   Has the patient been seen for an appointment in the last year OR does the patient have an upcoming appointment? Yes.    Agent: Please be advised that RX refills may take up to 3 business days. We ask that you follow-up with your pharmacy.

## 2022-05-21 NOTE — Telephone Encounter (Signed)
Requested medication (s) are due for refill today - unknown  Requested medication (s) are on the active medication list -yes  Future visit scheduled -yes  Last refill: unknown  Notes to clinic: historical provider  Requested Prescriptions  Pending Prescriptions Disp Refills   eletriptan (RELPAX) 40 MG tablet 10 tablet     Sig: Take by mouth as needed.     Neurology:  Migraine Therapy - Triptan Passed - 05/21/2022 11:15 AM      Passed - Last BP in normal range    BP Readings from Last 1 Encounters:  03/18/22 131/87         Passed - Valid encounter within last 12 months    Recent Outpatient Visits           2 months ago Encounter to establish care   [facility] at [facility], Salomon Fick, NP       Future Appointments             In 1 month Rema Fendt, NP Primary Care at [facility]               Requested Prescriptions  Pending Prescriptions Disp Refills   eletriptan (RELPAX) 40 MG tablet 10 tablet     Sig: Take by mouth as needed.     Neurology:  Migraine Therapy - Triptan Passed - 05/21/2022 11:15 AM      Passed - Last BP in normal range    BP Readings from Last 1 Encounters:  03/18/22 131/87         Passed - Valid encounter within last 12 months    Recent Outpatient Visits           2 months ago Encounter to establish care   Primary Care at [facility], Salomon Fick, NP       Future Appointments             In 1 month Rema Fendt, NP Primary Care at [facility]

## 2022-06-17 NOTE — Progress Notes (Deleted)
Patient ID: Kimberly Roberts, female    DOB: 13-Mar-1966  MRN: [id number]  CC: Annual Physical Exam  Subjective: Kimberly Roberts is a 56 y.o. female who presents annual physical exam.   Her concerns today include:  PAP Mammo  Colon ca   Patient Active Problem List   Diagnosis Date Noted   High risk medication use 07/17/2021   Rheumatoid arthritis of multiple sites with negative rheumatoid factor (HCC) 10/09/2020     Current Outpatient Medications on File Prior to Visit  Medication Sig Dispense Refill   amLODipine (NORVASC) 5 MG tablet Take 1 tablet (5 mg total) by mouth daily. 120 tablet 0   eletriptan (RELPAX) 40 MG tablet Take by mouth as needed.     etanercept (ENBREL SURECLICK) 50 MG/ML injection Inject 50 mg into the skin once a week. 4 mL 5   No current facility-administered medications on file prior to visit.    Allergies  Allergen Reactions   Doxycycline Anaphylaxis   Sulfa Antibiotics Nausea And Vomiting    Social History   Socioeconomic History   Marital status: Single    Spouse name: Not on file   Number of children: Not on file   Years of education: Not on file   Highest education level: Not on file  Occupational History   Not on file  Tobacco Use   Smoking status: Never   Smokeless tobacco: Never  Vaping Use   Vaping Use: Never used  Substance and Sexual Activity   Alcohol use: Not Currently   Drug use: Never   Sexual activity: Not on file  Other Topics Concern   Not on file  Social History Narrative   Not on file   Social Determinants of Health   Financial Resource Strain: Not on file  Food Insecurity: Not on file  Transportation Needs: Not on file  Physical Activity: Not on file  Stress: Not on file  Social Connections: Not on file  Intimate Partner Violence: Not on file    Family History  Problem Relation Age of Onset   Diabetes Mother    Hypertension Mother    Sarcoidosis Father    Prostate cancer Father     Hypertension Sister    Migraines Sister    Sickle cell trait Sister    Migraines Brother     Past Surgical History:  Procedure Laterality Date   BUNIONECTOMY Right    PARTIAL HYSTERECTOMY      ROS: Review of Systems Negative except as stated above  PHYSICAL EXAM: There were no vitals taken for this visit.  Physical Exam  {female adult master:310786} {female adult master:310785}     Latest Ref Rng & Units 01/31/2022    8:26 AM 07/17/2021    9:04 AM 04/18/2021    1:29 PM  CMP  Glucose 65 - 99 mg/dL 85  73  88   BUN 7 - 25 mg/dL 7  10  6    Creatinine 0.50 - 1.03 mg/dL  2.37  6.28   Sodium 135 - 146 mmol/L 141  141  135   Potassium 3.5 - 5.3 mmol/L 4.2  4.2  3.8   Chloride 98 - 110 mmol/L 107  106  101   CO2 20 - 32 mmol/L 28  29  26    Calcium 8.6 - 10.4 mg/dL 8.9  9.4  8.6   Total Protein 6.1 - 8.1 g/dL 7.0  7.5  7.7   Total Bilirubin 0.2 - 1.2 mg/dL 0.4  0.4  0.7   Alkaline Phos 38 - 126 U/L   74   AST 10 - 35 U/L 13  12  24    ALT 6 - 29 U/L 9  10  24     Lipid Panel  No results found for: "CHOL", "TRIG", "HDL", "CHOLHDL", "VLDL", "LDLCALC", "LDLDIRECT"  CBC    Component Value Date/Time   WBC 3.9 01/31/2022 0826   RBC 4.38 01/31/2022 0826   HGB 12.5 01/31/2022 0826   HCT 38.3 01/31/2022 0826   PLT 259 01/31/2022 0826   MCV 87.4 01/31/2022 0826   MCH 28.5 01/31/2022 0826   MCHC 32.6 01/31/2022 0826   RDW 12.8 01/31/2022 0826   LYMPHSABS 1,400 01/31/2022 0826   MONOABS 0.9 04/18/2021 1329   EOSABS 148 01/31/2022 0826   BASOSABS 39 01/31/2022 0826    ASSESSMENT AND PLAN:  There are no diagnoses linked to this encounter.   Patient was given the opportunity to ask questions.  Patient verbalized understanding of the plan and was able to repeat key elements of the plan. Patient was given clear instructions to go to Emergency Department or return to medical center if symptoms don't improve, worsen, or new problems develop.The patient verbalized  understanding.   No orders of the defined types were placed in this encounter.    Requested Prescriptions    No prescriptions requested or ordered in this encounter    No follow-ups on file.  04/02/2022, NP

## 2022-06-21 IMAGING — DX DG CHEST 2V
2 series · 2 of 2 positions shown · non-contrast
Comparison: None.

CLINICAL DATA: Cough and fever.

EXAM:
CHEST - 2 VIEW

[chest pa]
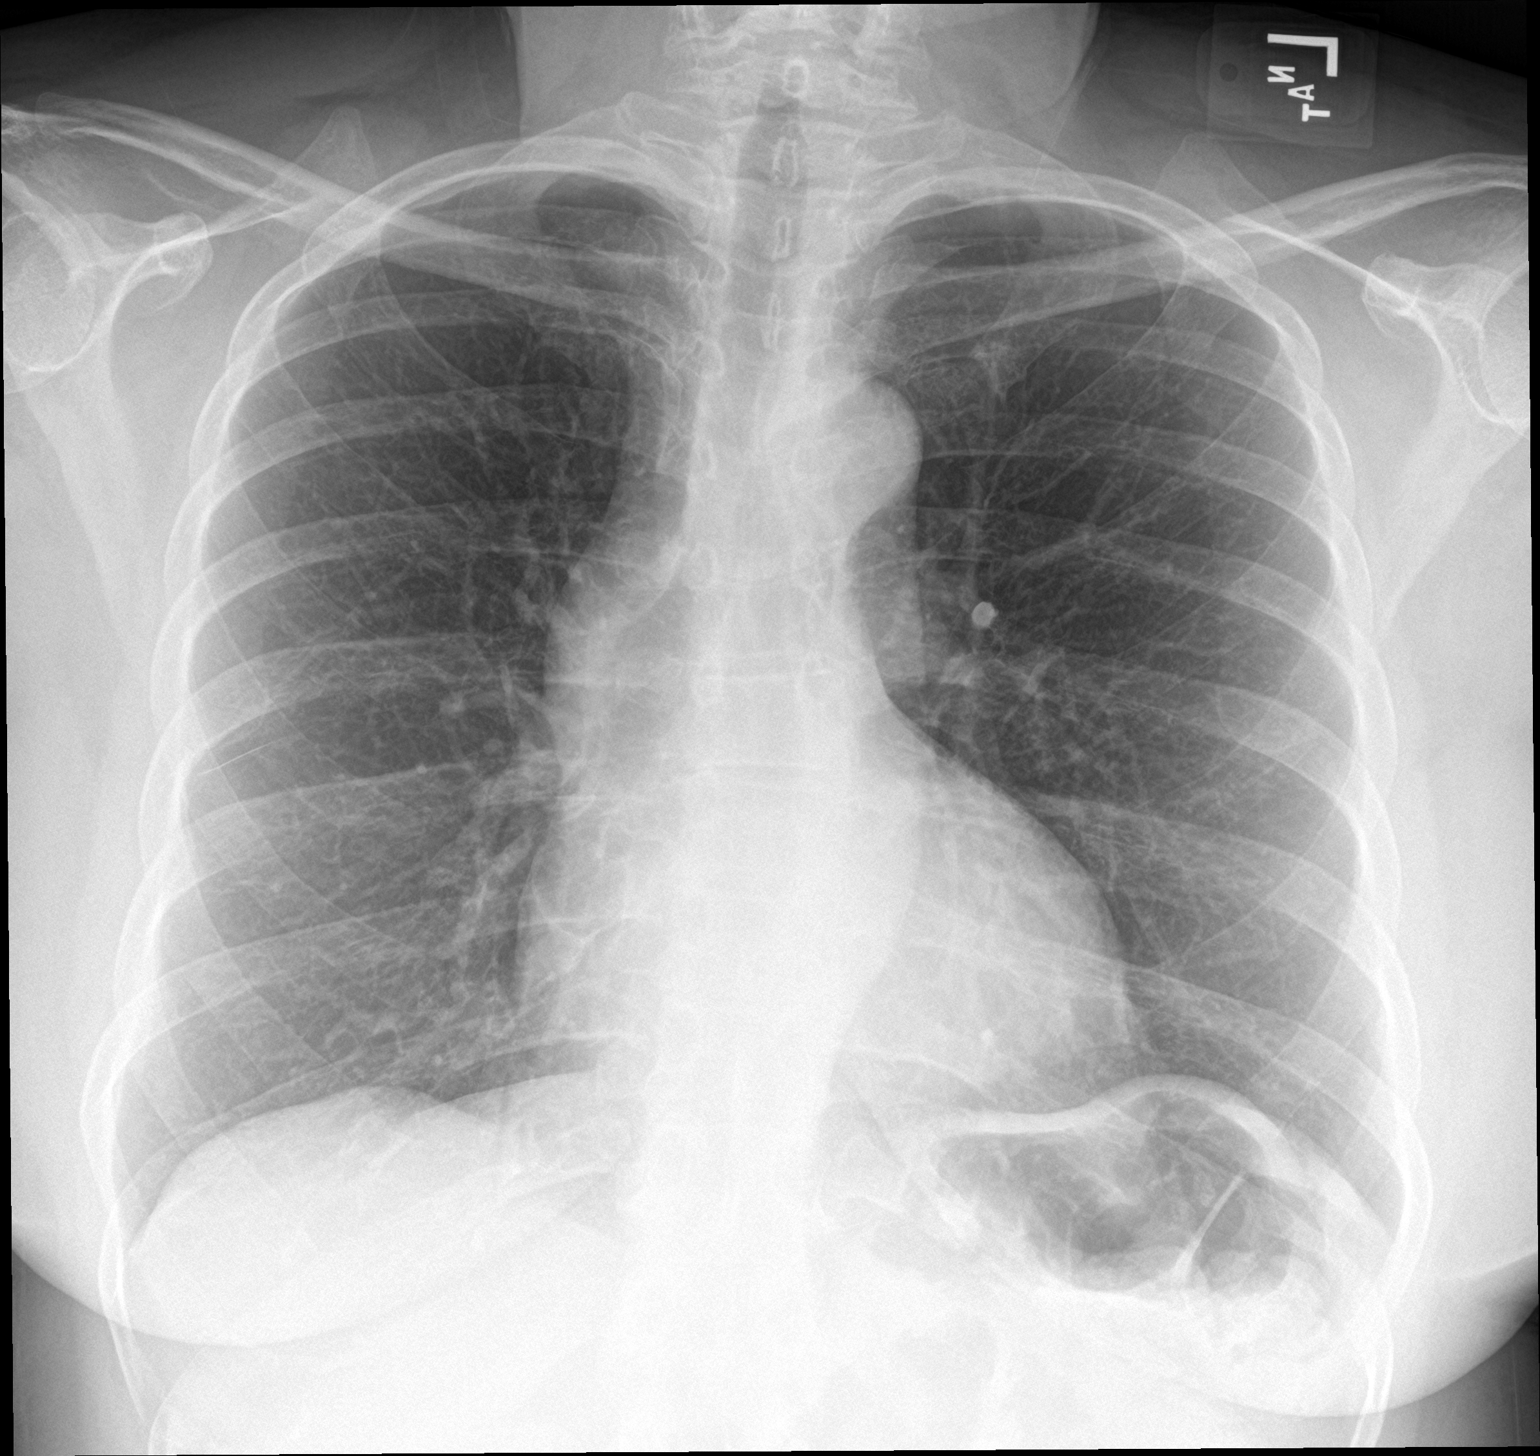

[chest lat]
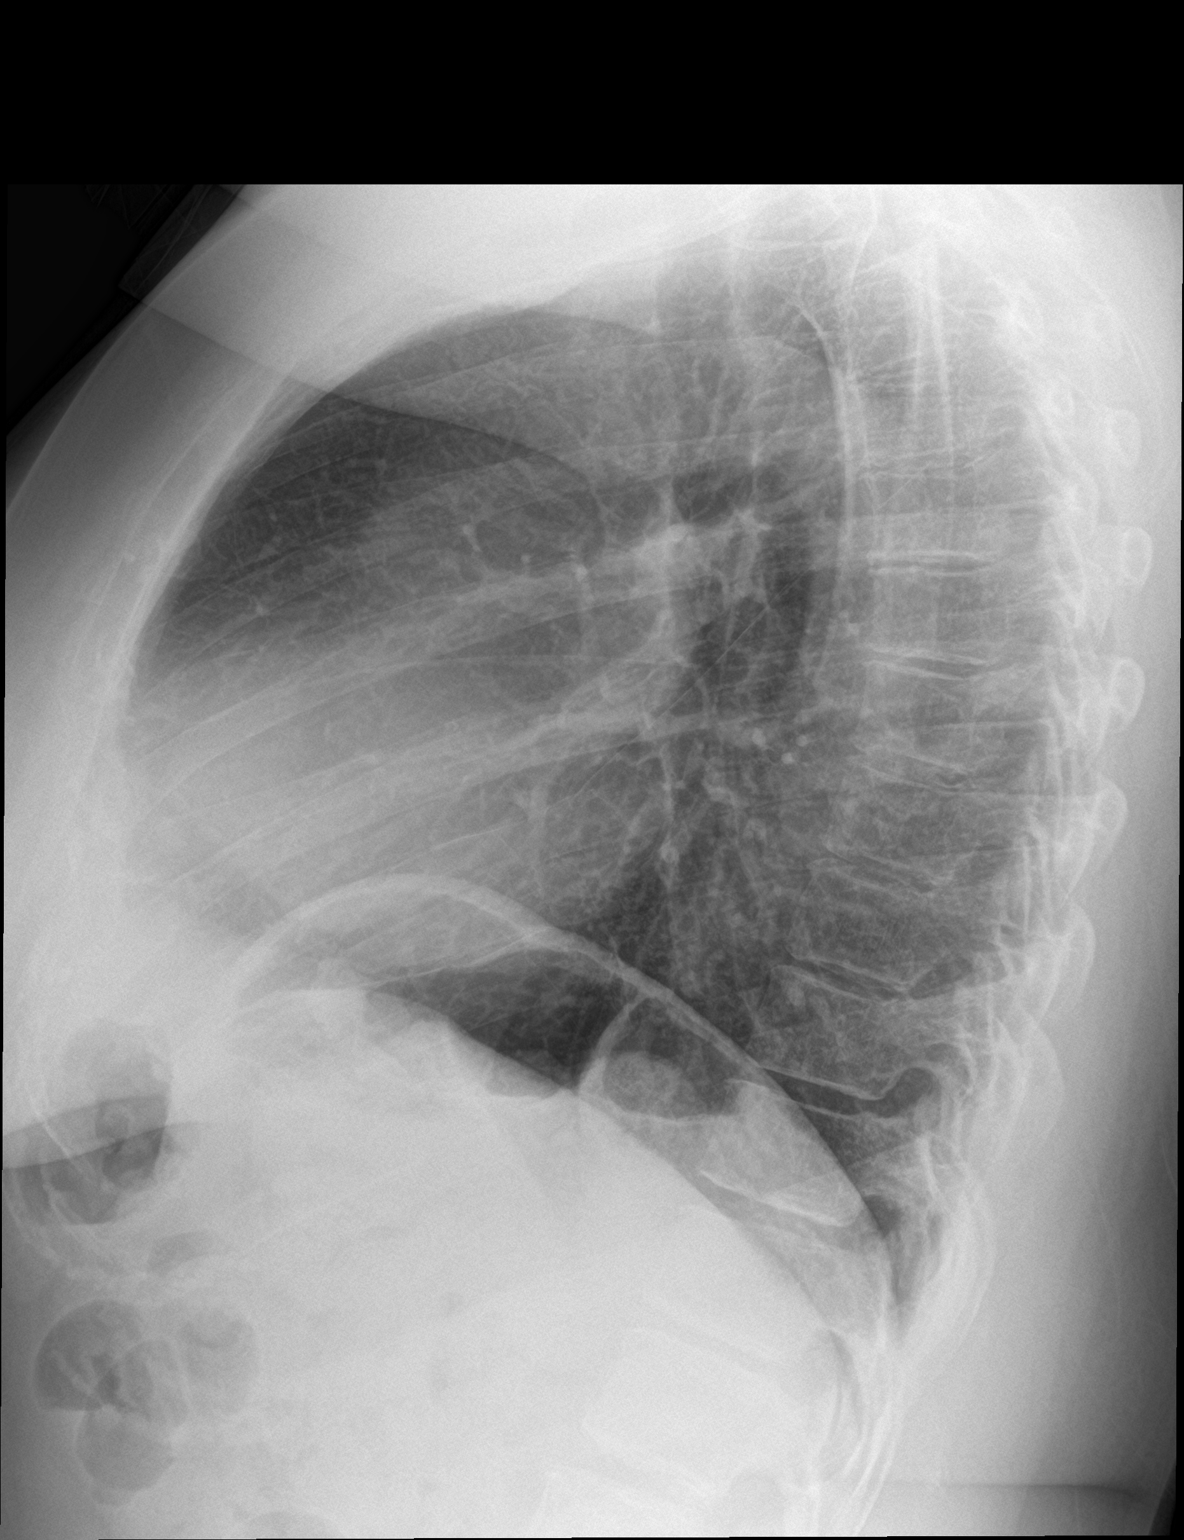

[2 of 2 positions shown; findings below may reference images not displayed]

FINDINGS: The heart size and mediastinal contours are within normal limits.
Both lungs are clear. The visualized skeletal structures are
unremarkable.
IMPRESSION: No active cardiopulmonary disease.

## 2022-06-25 ENCOUNTER — Encounter: Payer: 59 | Admitting: Family

## 2022-06-25 DIAGNOSIS — Z124 Encounter for screening for malignant neoplasm of cervix: Secondary | ICD-10-CM

## 2022-06-25 DIAGNOSIS — Z131 Encounter for screening for diabetes mellitus: Secondary | ICD-10-CM

## 2022-06-25 DIAGNOSIS — Z1159 Encounter for screening for other viral diseases: Secondary | ICD-10-CM

## 2022-06-25 DIAGNOSIS — Z13220 Encounter for screening for lipoid disorders: Secondary | ICD-10-CM

## 2022-06-25 DIAGNOSIS — Z113 Encounter for screening for infections with a predominantly sexual mode of transmission: Secondary | ICD-10-CM

## 2022-06-25 DIAGNOSIS — Z0000 Encounter for general adult medical examination without abnormal findings: Secondary | ICD-10-CM

## 2022-06-25 DIAGNOSIS — Z1329 Encounter for screening for other suspected endocrine disorder: Secondary | ICD-10-CM

## 2022-06-25 DIAGNOSIS — Z114 Encounter for screening for human immunodeficiency virus [HIV]: Secondary | ICD-10-CM

## 2022-06-25 DIAGNOSIS — Z1231 Encounter for screening mammogram for malignant neoplasm of breast: Secondary | ICD-10-CM

## 2022-06-25 DIAGNOSIS — Z1211 Encounter for screening for malignant neoplasm of colon: Secondary | ICD-10-CM

## 2022-07-09 ENCOUNTER — Other Ambulatory Visit: Payer: Self-pay | Admitting: Internal Medicine

## 2022-07-09 DIAGNOSIS — Z79899 Other long term (current) drug therapy: Secondary | ICD-10-CM

## 2022-07-09 DIAGNOSIS — M0609 Rheumatoid arthritis without rheumatoid factor, multiple sites: Secondary | ICD-10-CM

## 2022-07-09 NOTE — Telephone Encounter (Addendum)
Next Visit: Not scheduled  Last Visit: 01/31/2022  Last Fill: 01/31/2022  GU:[id number] arthritis of multiple sites with negative rheumatoid factor  Current Dose per office note 01/31/2022: ENBREL SURECLICK 50mg /ml injection into the skin once a week  Labs: 01/31/2022 CMP WNL CBC WNL  TB Gold: 07/17/2021 NEGATIVE   Called patient to inform them they will need an up to date test soon. Left message for them to call back.  Okay to refill ENBREL?

## 2022-07-09 NOTE — Telephone Encounter (Signed)
LMOM for patient to call and schedule follow-up appointment.   °

## 2022-07-11 ENCOUNTER — Telehealth: Payer: Self-pay

## 2022-07-11 NOTE — Telephone Encounter (Signed)
Will refill x1 for now to avoid interruption but patient needs to schedule clinic follow up or will not be able to do any more.

## 2022-07-11 NOTE — Telephone Encounter (Signed)
Left message with patient to let them know that they will need to schedule clinic visit in order to refill Enbrel.

## 2022-07-21 NOTE — Progress Notes (Deleted)
Patient ID: Kimberly Roberts, female    DOB: 16-Aug-1966  MRN: [id number]  CC: Annual Physical Exam  Subjective: Kimberly Roberts is a 56 y.o. female who presents annual physical exam.   Her concerns today include:  PAP Mammo  Colon ca HTN - Amlodipine    Patient Active Problem List   Diagnosis Date Noted   High risk medication use 07/17/2021   Rheumatoid arthritis of multiple sites with negative rheumatoid factor (HCC) 10/09/2020     Current Outpatient Medications on File Prior to Visit  Medication Sig Dispense Refill   amLODipine (NORVASC) 5 MG tablet Take 1 tablet (5 mg total) by mouth daily. 120 tablet 0   eletriptan (RELPAX) 40 MG tablet Take by mouth as needed.     ENBREL SURECLICK 50 MG/ML injection INJECT 50MG  SUBCUTANEOUSLY  WEEKLY 4 mL 1   No current facility-administered medications on file prior to visit.    Allergies  Allergen Reactions   Doxycycline Anaphylaxis   Sulfa Antibiotics Nausea And Vomiting    Social History   Socioeconomic History   Marital status: Single    Spouse name: Not on file   Number of children: Not on file   Years of education: Not on file   Highest education level: Not on file  Occupational History   Not on file  Tobacco Use   Smoking status: Never   Smokeless tobacco: Never  Vaping Use   Vaping Use: Never used  Substance and Sexual Activity   Alcohol use: Not Currently   Drug use: Never   Sexual activity: Not on file  Other Topics Concern   Not on file  Social History Narrative   Not on file   Social Determinants of Health   Financial Resource Strain: Not on file  Food Insecurity: Not on file  Transportation Needs: Not on file  Physical Activity: Not on file  Stress: Not on file  Social Connections: Not on file  Intimate Partner Violence: Not on file    Family History  Problem Relation Age of Onset   Diabetes Mother    Hypertension Mother    Sarcoidosis Father    Prostate cancer Father     Hypertension Sister    Migraines Sister    Sickle cell trait Sister    Migraines Brother     Past Surgical History:  Procedure Laterality Date   BUNIONECTOMY Right    PARTIAL HYSTERECTOMY      ROS: Review of Systems Negative except as stated above  PHYSICAL EXAM: There were no vitals taken for this visit.  Physical Exam  {female adult master:310786} {female adult master:310785}     Latest Ref Rng & Units 01/31/2022    8:26 AM 07/17/2021    9:04 AM 04/18/2021    1:29 PM  CMP  Glucose 65 - 99 mg/dL 85  73  88   BUN 7 - 25 mg/dL 7  10  6    Creatinine 0.50 - 1.03 mg/dL 04/20/2021   0.93   Sodium 135 - 146 mmol/L 141  141  135   Potassium 3.5 - 5.3 mmol/L 4.2  4.2  3.8   Chloride 98 - 110 mmol/L 107  106  101   CO2 20 - 32 mmol/L 28  29  26    Calcium 8.6 - 10.4 mg/dL 8.9  9.4  8.6   Total Protein 6.1 - 8.1 g/dL 7.0  7.5  7.7   Total Bilirubin 0.2 - 1.2 mg/dL 0.4  0.4  0.7   Alkaline Phos 38 - 126 U/L   74   AST 10 - 35 U/L 13  12  24    ALT 6 - 29 U/L 9  10  24     Lipid Panel  No results found for: "CHOL", "TRIG", "HDL", "CHOLHDL", "VLDL", "LDLCALC", "LDLDIRECT"  CBC    Component Value Date/Time   WBC 3.9 01/31/2022 0826   RBC 4.38 01/31/2022 0826   HGB 12.5 01/31/2022 0826   HCT 38.3 01/31/2022 0826   PLT 259 01/31/2022 0826   MCV 87.4 01/31/2022 0826   MCH 28.5 01/31/2022 0826   MCHC 32.6 01/31/2022 0826   RDW 12.8 01/31/2022 0826   LYMPHSABS 1,400 01/31/2022 0826   MONOABS 0.9 04/18/2021 1329   EOSABS 148 01/31/2022 0826   BASOSABS 39 01/31/2022 0826    ASSESSMENT AND PLAN:  There are no diagnoses linked to this encounter.   Patient was given the opportunity to ask questions.  Patient verbalized understanding of the plan and was able to repeat key elements of the plan. Patient was given clear instructions to go to Emergency Department or return to medical center if symptoms don't improve, worsen, or new problems develop.The patient verbalized  understanding.   No orders of the defined types were placed in this encounter.    Requested Prescriptions    No prescriptions requested or ordered in this encounter    No follow-ups on file.  04/02/2022, NP

## 2022-07-30 ENCOUNTER — Encounter: Payer: 59 | Admitting: Family

## 2022-07-30 DIAGNOSIS — Z1211 Encounter for screening for malignant neoplasm of colon: Secondary | ICD-10-CM

## 2022-07-30 DIAGNOSIS — Z113 Encounter for screening for infections with a predominantly sexual mode of transmission: Secondary | ICD-10-CM

## 2022-07-30 DIAGNOSIS — Z114 Encounter for screening for human immunodeficiency virus [HIV]: Secondary | ICD-10-CM

## 2022-07-30 DIAGNOSIS — Z131 Encounter for screening for diabetes mellitus: Secondary | ICD-10-CM

## 2022-07-30 DIAGNOSIS — Z0000 Encounter for general adult medical examination without abnormal findings: Secondary | ICD-10-CM

## 2022-07-30 DIAGNOSIS — Z13228 Encounter for screening for other metabolic disorders: Secondary | ICD-10-CM

## 2022-07-30 DIAGNOSIS — Z1329 Encounter for screening for other suspected endocrine disorder: Secondary | ICD-10-CM

## 2022-07-30 DIAGNOSIS — Z13220 Encounter for screening for lipoid disorders: Secondary | ICD-10-CM

## 2022-07-30 DIAGNOSIS — Z1159 Encounter for screening for other viral diseases: Secondary | ICD-10-CM

## 2022-07-30 DIAGNOSIS — Z1231 Encounter for screening mammogram for malignant neoplasm of breast: Secondary | ICD-10-CM

## 2022-07-30 DIAGNOSIS — Z130 Encounter for screening for diseases of the blood and blood-forming organs and certain disorders involving the immune mechanism: Secondary | ICD-10-CM

## 2022-07-30 DIAGNOSIS — Z124 Encounter for screening for malignant neoplasm of cervix: Secondary | ICD-10-CM

## 2022-07-31 NOTE — Progress Notes (Signed)
Office Visit Note  Patient: Kimberly Roberts             Date of Birth: 03/27/1966           MRN: [id number]             PCP: Camillia Herter, NP Referring: Camillia Herter, NP Visit Date: 08/11/2022   Subjective:  Follow-up (Feeling pretty good. Medicine is effective. )   History of Present Illness: Kimberly Roberts is a 56 y.o. female here for follow up for seronegative RA on Enbrel 50 mg weekly. She is doing well still has a few minutes of morning stiffness daily. No major flare up and no problems taking the enbrel. No serious infections.  Previous HPI 01/31/2022 Kimberly Roberts is a 56 y.o. female here for follow up for seronegative RA on Enbrel 50 mg [place] weekly. She feels symptoms are well controlled joint stiffness in the mornings improves usually within about 15 minutes or less or with taking a hot shower. She still has intermittent fatigue not excessively sleepy or falling asleep during the day. She recalls this symptom since years ago it was slightly less when on both enbrel and methotrexate but she thinks it is less of a problem than the methotrexate side effects. No interval infections or major medical changes.   Previous HPI 07/17/21 Kimberly Roberts is a 56 y.o. female here for rheumatoid arthritis.  She was originally diagnosed in [place] in 2016 after development of bilateral palmar erythematous rashes with joint pain and swelling in her wrists and proximal finger joints. This was initially treated with steroids and then went on subcutaneous methotrexate she took this for about 6 months as monotherapy with a fair amount of improvement though tolerated the medicine poorly with persistent fatigue nausea and feeling sick continuously.  Addition of Enbrel resulted in complete symptom improvement and she subsequently came off the methotrexate did extremely well on Enbrel monotherapy for the past 2 years.  She moved from [place] to this area around December for a new  job position and to be closer with family and needs to reestablish rheumatology care.  She has run out of the medicine and is off Enbrel since about 2 months ago.  She is starting to notice increasing symptoms although still mild in the left wrist and in the right foot but without obvious swelling, and also noticing a significant increase in generalized fatigue.   DMARD Hx Enbrel 2017- current Methotrexate 2016-2019   Labs reviewed 2016 ANA 1:640 Nuclear dots RNP, SSA, SSB, Smith, dsDNA, centromere, Scl-7, ACA neg RF neg CCP neg HLA-B27 neg G6PD 8.6 (9.9-16.6) Uric acid 3.5   Imaging reviewed (reports) 2016 CXR no active airspace disease 2016 Bilateral hand xrays without erosive disease   Review of Systems  Constitutional:  Negative for fatigue.  HENT:  Negative for mouth sores and mouth dryness.   Eyes:  Negative for dryness.  Respiratory:  Negative for shortness of breath.   Cardiovascular:  Negative for chest pain and palpitations.  Gastrointestinal:  Negative for blood in stool, constipation and diarrhea.  Endocrine: Negative for increased urination.  Genitourinary:  Negative for involuntary urination.  Musculoskeletal:  Positive for morning stiffness. Negative for joint pain, gait problem, joint pain, joint swelling, myalgias, muscle weakness, muscle tenderness and myalgias.  Skin:  Negative for color change, rash, hair loss and sensitivity to sunlight.  Allergic/Immunologic: Negative for susceptible to infections.  Neurological:  Positive for headaches. Negative for dizziness.  Hematological:  Negative for swollen glands.  Psychiatric/Behavioral:  Negative for depressed mood and sleep disturbance. The patient is not nervous/anxious.     PMFS History:  Patient Active Problem List   Diagnosis Date Noted   High risk medication use 07/17/2021   Rheumatoid arthritis of multiple sites with negative rheumatoid factor ([place]) 10/09/2020    Past Medical History:  Diagnosis  Date   Hypertension    Migraines    Rheumatoid arthritis ([place])    Sickle cell trait ([place])     Family History  Problem Relation Age of Onset   Diabetes Mother    Hypertension Mother    Sarcoidosis Father    Prostate cancer Father    Hypertension Sister    Migraines Sister    Sickle cell trait Sister    Migraines Brother    Past Surgical History:  Procedure Laterality Date   BUNIONECTOMY Right    PARTIAL HYSTERECTOMY     Social History   Social History Narrative   Not on file   Immunization History  Administered Date(s) Administered   Tdap 08/23/2018   Zoster, Live 08/05/2017, 12/23/2017     Objective: Vital Signs: BP 130/86 (BP Location: Left Arm, Patient Position: Sitting, Cuff Size: Normal)   Pulse 64   Resp 14   Ht 5' 2.5" (1.588 m)   Wt 151 lb 12.8 oz (68.9 kg)   BMI 27.32 kg/m    Physical Exam Cardiovascular:     Rate and Rhythm: Normal rate and regular rhythm.  Pulmonary:     Effort: Pulmonary effort is normal.     Breath sounds: Normal breath sounds.  Musculoskeletal:     Right lower leg: No edema.     Left lower leg: No edema.  Skin:    General: Skin is warm and dry.     Findings: No rash.  Neurological:     Mental Status: She is alert.  Psychiatric:        Mood and Affect: Mood normal.      Musculoskeletal Exam:  Neck full ROM no tenderness Shoulders full ROM no tenderness or swelling Elbows full ROM no tenderness or swelling Wrists full ROM no tenderness or swelling Fingers full ROM no tenderness or swelling Knees full ROM no tenderness or swelling Ankles full ROM no tenderness or swelling   CDAI Exam: CDAI Score: 2  Patient Global: 10 mm; Provider Global: 10 mm Swollen: 0 ; Tender: 0  Joint Exam 08/11/2022   All documented joints were normal     Investigation: No additional findings.  Imaging: No results found.  Recent Labs: Lab Results  Component Value Date   WBC 3.9 01/31/2022   HGB 12.5 01/31/2022   PLT 259  01/31/2022   NA 141 01/31/2022   K 4.2 01/31/2022   CL 107 01/31/2022   CO2 28 01/31/2022   GLUCOSE 85 01/31/2022   BUN 7 01/31/2022   CREATININE 0.66 01/31/2022   BILITOT 0.4 01/31/2022   ALKPHOS 74 04/18/2021   AST 13 01/31/2022   ALT 9 01/31/2022   PROT 7.0 01/31/2022   ALBUMIN 3.5 04/18/2021   CALCIUM 8.9 01/31/2022   QFTBGOLDPLUS NEGATIVE 07/17/2021    Speciality Comments: No specialty comments available.  Procedures:  No procedures performed Allergies: Doxycycline and Sulfa antibiotics   Assessment / Plan:     Visit Diagnoses: Rheumatoid arthritis of multiple sites with negative rheumatoid factor ([place]) - Plan: Sedimentation rate, etanercept (ENBREL SURECLICK) 50 MG/ML injection  Disease appears very well controlled  with low activity or remission on Enbrel monotherapy.  Checking sedimentation rate for disease activity monitoring.  Plan to continue the Enbrel 50 mg subcu weekly.  High risk medication use -  Enbrel 50 mg [place] weekly  - Plan: CBC with Differential/Platelet, COMPLETE METABOLIC PANEL WITH GFR, QuantiFERON-TB Gold Plus, etanercept (ENBREL SURECLICK) 50 MG/ML injection  Checking CBC and CMP and QuantiFERON for Enbrel medication monitoring.  She has had no interval infections or complications.    Orders: Orders Placed This Encounter  Procedures   Sedimentation rate   CBC with Differential/Platelet   COMPLETE METABOLIC PANEL WITH GFR   QuantiFERON-TB Gold Plus   Meds ordered this encounter  Medications   etanercept (ENBREL SURECLICK) 50 MG/ML injection    Sig: Inject 50 mg into the skin once a week.    Dispense:  4 mL    Refill:  6     Follow-Up Instructions: Return in about 6 months (around 02/09/2023) for Stable RA on ENB f/u 39mo.   CCollier Salina MD  Note - This record has been created using [employer]  Chart creation errors have been sought, but may not always  have been located. Such creation errors do not reflect on  the standard  of medical care.

## 2022-08-11 ENCOUNTER — Ambulatory Visit: Payer: 59 | Attending: Internal Medicine | Admitting: Internal Medicine

## 2022-08-11 ENCOUNTER — Encounter: Payer: Self-pay | Admitting: Internal Medicine

## 2022-08-11 VITALS — BP 130/86 | HR 64 | Resp 14 | Ht 62.5 in | Wt 151.8 lb

## 2022-08-11 DIAGNOSIS — M0609 Rheumatoid arthritis without rheumatoid factor, multiple sites: Secondary | ICD-10-CM

## 2022-08-11 DIAGNOSIS — Z79899 Other long term (current) drug therapy: Secondary | ICD-10-CM | POA: Diagnosis not present

## 2022-08-11 MED ORDER — ENBREL SURECLICK 50 MG/ML ~~LOC~~ SOAJ: 50.0000 mg | SUBCUTANEOUS | 6 refills | Status: DC

## 2022-08-12 NOTE — Progress Notes (Signed)
Lab results look fine for continuing the Enbrel no new changes needed.

## 2022-08-14 LAB — CBC WITH DIFFERENTIAL/PLATELET
Absolute Monocytes: 308 cells/uL (ref 200–950)
Basophils Absolute: 38 cells/uL (ref 0–200)
Basophils Relative: 1 %
Eosinophils Absolute: 49 cells/uL (ref 15–500)
Eosinophils Relative: 1.3 %
HCT: 41.9 % (ref 35.0–45.0)
Hemoglobin: 13.4 g/dL (ref 11.7–15.5)
Lymphs Abs: 1737 cells/uL (ref 850–3900)
MCH: 28.5 pg (ref 27.0–33.0)
MCHC: 32 g/dL (ref 32.0–36.0)
MCV: 89.1 fL (ref 80.0–100.0)
MPV: 11.9 fL (ref 7.5–12.5)
Monocytes Relative: 8.1 %
Neutro Abs: 1668 cells/uL (ref 1500–7800)
Neutrophils Relative %: 43.9 %
Platelets: 285 10*3/uL (ref 140–400)
RBC: 4.7 10*6/uL (ref 3.80–5.10)
RDW: 12.3 % (ref 11.0–15.0)
Total Lymphocyte: 45.7 %
WBC: 3.8 10*3/uL (ref 3.8–10.8)

## 2022-08-14 LAB — COMPLETE METABOLIC PANEL WITH GFR
AG Ratio: 1.1 (calc) (ref 1.0–2.5)
ALT: 9 U/L (ref 6–29)
AST: 12 U/L (ref 10–35)
Albumin: 4.1 g/dL (ref 3.6–5.1)
Alkaline phosphatase (APISO): 92 U/L (ref 37–153)
BUN: 8 mg/dL (ref 7–25)
CO2: 28 mmol/L (ref 20–32)
Calcium: 9.7 mg/dL (ref 8.6–10.4)
Chloride: 107 mmol/L (ref 98–110)
Creat: 0.74 mg/dL (ref 0.50–1.03)
Globulin: 3.6 g/dL (calc) (ref 1.9–3.7)
Glucose, Bld: 86 mg/dL (ref 65–99)
Potassium: 4.4 mmol/L (ref 3.5–5.3)
Sodium: 143 mmol/L (ref 135–146)
Total Bilirubin: 0.4 mg/dL (ref 0.2–1.2)
Total Protein: 7.7 g/dL (ref 6.1–8.1)
eGFR: 95 mL/min/{1.73_m2} (ref >=60)

## 2022-08-14 LAB — QUANTIFERON-TB GOLD PLUS
Mitogen-NIL: >10 IU/mL
NIL: 0.05 IU/mL
QuantiFERON-TB Gold Plus: NEGATIVE
TB1-NIL: <0 IU/mL
TB2-NIL: <0 IU/mL

## 2022-08-14 LAB — SEDIMENTATION RATE: Sed Rate: 6 mm/h (ref 0–30)

## 2022-08-31 NOTE — Progress Notes (Signed)
Patient ID: Kimberly Roberts, female    DOB: 1966/05/12  MRN: [id number]  CC: Annual Physical Exam  Subjective: Kimberly Roberts is a 56 y.o. female who presents for annual physical exam.   Her concerns today include:  - Reports she will be completing mammogram on 09/22/2022 with [place] mammogram screening event at her job [employer]. - Requests referral to Gynecology for completion of pap smear and STI screening.  - Discussed with patient recommendation to consult with her rheumatologist prior to receiving flu vaccine. Patient agreeable and states she will update [place] if flu vaccine is approved so that she can return to our office and have administered.  - Doing well on blood pressure medication, no issues/concerns.    Patient Active Problem List   Diagnosis Date Noted   High risk medication use 07/17/2021   Rheumatoid arthritis of multiple sites with negative rheumatoid factor ([place]) 10/09/2020     Current Outpatient Medications on File Prior to Visit  Medication Sig Dispense Refill   eletriptan (RELPAX) 40 MG tablet Take by mouth as needed.     etanercept (ENBREL SURECLICK) 50 MG/ML injection Inject 50 mg into the skin once a week. 4 mL 6   No current facility-administered medications on file prior to visit.    Allergies  Allergen Reactions   Doxycycline Anaphylaxis   Sulfa Antibiotics Nausea And Vomiting    Social History   Socioeconomic History   Marital status: Single    Spouse name: Not on file   Number of children: Not on file   Years of education: Not on file   Highest education level: Not on file  Occupational History   Not on file  Tobacco Use   Smoking status: Never    Passive exposure: Never   Smokeless tobacco: Never  Vaping Use   Vaping Use: Never used  Substance and Sexual Activity   Alcohol use: Not Currently   Drug use: Never   Sexual activity: Not on file  Other Topics Concern   Not on file  Social History Narrative   Not on file    Social Determinants of Health   Financial Resource Strain: Not on file  Food Insecurity: Not on file  Transportation Needs: Not on file  Physical Activity: Not on file  Stress: Not on file  Social Connections: Not on file  Intimate Partner Violence: Not on file    Family History  Problem Relation Age of Onset   Diabetes Mother    Hypertension Mother    Sarcoidosis Father    Prostate cancer Father    Hypertension Sister    Migraines Sister    Sickle cell trait Sister    Migraines Brother     Past Surgical History:  Procedure Laterality Date   BUNIONECTOMY Right    PARTIAL HYSTERECTOMY      ROS: Review of Systems Negative except as stated above  PHYSICAL EXAM: BP 137/88 (BP Location: Left Arm, Patient Position: Sitting, Cuff Size: Normal)   Pulse 64   Temp 98 F (36.7 C)   Resp 16   Ht 5' 2.52" (1.588 m)   Wt 150 lb (68 kg)   SpO2 98%   BMI 26.98 kg/m   Physical Exam HENT:     Head: Normocephalic and atraumatic.     Right Ear: Tympanic membrane, ear canal and external ear normal.     Left Ear: Tympanic membrane, ear canal and external ear normal.     Nose: Nose normal.  Mouth/Throat:     Mouth: Mucous membranes are moist.     Pharynx: Oropharynx is clear.  Eyes:     Extraocular Movements: Extraocular movements intact.     Conjunctiva/sclera: Conjunctivae normal.     Pupils: Pupils are equal, round, and reactive to light.  Cardiovascular:     Rate and Rhythm: Normal rate and regular rhythm.     Pulses: Normal pulses.     Heart sounds: Normal heart sounds.  Pulmonary:     Effort: Pulmonary effort is normal.     Breath sounds: Normal breath sounds.  Chest:     Comments: Patient declined.  Abdominal:     General: Bowel sounds are normal.     Palpations: Abdomen is soft.  Genitourinary:    Comments: Patient declined.  Musculoskeletal:        General: Normal range of motion.     Right shoulder: Normal.     Left shoulder: Normal.     Right  upper arm: Normal.     Left upper arm: Normal.     Right elbow: Normal.     Left elbow: Normal.     Right forearm: Normal.     Left forearm: Normal.     Right wrist: Normal.     Left wrist: Normal.     Right hand: Normal.     Left hand: Normal.     Cervical back: Normal, normal range of motion and neck supple.     Thoracic back: Normal.     Lumbar back: Normal.     Right hip: Normal.     Left hip: Normal.     Right upper leg: Normal.     Left upper leg: Normal.     Right knee: Normal.     Left knee: Normal.     Right lower leg: Normal.     Left lower leg: Normal.     Right ankle: Normal.     Left ankle: Normal.     Right foot: Normal.     Left foot: Normal.  Skin:    General: Skin is warm and dry.     Capillary Refill: Capillary refill takes less than 2 seconds.  Neurological:     General: No focal deficit present.     Mental Status: She is alert and oriented to person, place, and time.  Psychiatric:        Mood and Affect: Mood normal.        Behavior: Behavior normal.     ASSESSMENT AND PLAN: 1. Annual physical exam - Counseled on 150 minutes of exercise per week as tolerated, healthy eating (including decreased daily intake of saturated fats, cholesterol, added sugars, sodium), STI prevention, and routine healthcare maintenance.  2. Diabetes mellitus screening - Routine screening.  - Hemoglobin A1c  3. Screening cholesterol level - Routine screening.  - Lipid panel  4. Thyroid disorder screen - Routine screening.  - TSH  5. Encounter for screening mammogram for malignant neoplasm of breast - Patient reports she will be completing mammogram on 09/22/2022 with [place] mammogram screening event at her job [employer].  6. Pap smear for cervical cancer screening 7. Routine screening for STI (sexually transmitted infection) - Referral to Gynecology for further evaluation and management. - Ambulatory referral to Gynecology  8. Encounter for screening for HIV -  Routine screening.  - HIV antibody (with reflex)  9. Colon cancer screening - Referral to Gastroenterology for colon cancer screening by colonoscopy. - Ambulatory referral to  Gastroenterology  10. Primary hypertension - Continue Amlodipine as prescribed.  - Counseled on blood pressure goal of less than 130/80, low-sodium, DASH diet, medication compliance, and 150 minutes of moderate intensity exercise per week as tolerated. Counseled on medication adherence and adverse effects. - Follow-up with primary provider in 3 months or sooner if needed.  - amLODipine (NORVASC) 5 MG tablet; Take 1 tablet (5 mg total) by mouth daily.  Dispense: 30 tablet; Refill: 2  11. Need for immunization against influenza - Discussed with patient recommendation to consult with her rheumatologist prior to receiving flu vaccine. Patient agreeable and states she will update [place] if flu vaccine is approved so that she can return to our office and have administered.   12. Need for hepatitis C screening test - Routine screening.  - Hepatitis C Antibody    Patient was given the opportunity to ask questions.  Patient verbalized understanding of the plan and was able to repeat key elements of the plan. Patient was given clear instructions to go to Emergency Department or return to medical center if symptoms don't improve, worsen, or new problems develop.The patient verbalized understanding.   Orders Placed This Encounter  Procedures   HIV antibody (with reflex)   Lipid panel   Hemoglobin A1c   TSH   Hepatitis C Antibody   Ambulatory referral to Gastroenterology   Ambulatory referral to Gynecology     Requested Prescriptions   Signed Prescriptions Disp Refills   amLODipine (NORVASC) 5 MG tablet 30 tablet 2    Sig: Take 1 tablet (5 mg total) by mouth daily.    Return in about 1 year (around 09/09/2023) for Physical per patient preference, Follow-Up or next available 3 months chronic care mgmt.  Rema Fendt, NP

## 2022-09-08 ENCOUNTER — Encounter: Payer: Self-pay | Admitting: Family

## 2022-09-08 ENCOUNTER — Ambulatory Visit (INDEPENDENT_AMBULATORY_CARE_PROVIDER_SITE_OTHER): Payer: 59 | Admitting: Family

## 2022-09-08 VITALS — BP 137/88 | HR 64 | Temp 98.0°F | Resp 16 | Ht 62.52 in | Wt 150.0 lb

## 2022-09-08 DIAGNOSIS — Z131 Encounter for screening for diabetes mellitus: Secondary | ICD-10-CM

## 2022-09-08 DIAGNOSIS — Z1231 Encounter for screening mammogram for malignant neoplasm of breast: Secondary | ICD-10-CM

## 2022-09-08 DIAGNOSIS — Z0000 Encounter for general adult medical examination without abnormal findings: Secondary | ICD-10-CM

## 2022-09-08 DIAGNOSIS — Z1211 Encounter for screening for malignant neoplasm of colon: Secondary | ICD-10-CM

## 2022-09-08 DIAGNOSIS — Z23 Encounter for immunization: Secondary | ICD-10-CM

## 2022-09-08 DIAGNOSIS — Z114 Encounter for screening for human immunodeficiency virus [HIV]: Secondary | ICD-10-CM

## 2022-09-08 DIAGNOSIS — Z113 Encounter for screening for infections with a predominantly sexual mode of transmission: Secondary | ICD-10-CM

## 2022-09-08 DIAGNOSIS — Z13220 Encounter for screening for lipoid disorders: Secondary | ICD-10-CM | POA: Diagnosis not present

## 2022-09-08 DIAGNOSIS — Z124 Encounter for screening for malignant neoplasm of cervix: Secondary | ICD-10-CM

## 2022-09-08 DIAGNOSIS — Z1159 Encounter for screening for other viral diseases: Secondary | ICD-10-CM

## 2022-09-08 DIAGNOSIS — Z1329 Encounter for screening for other suspected endocrine disorder: Secondary | ICD-10-CM

## 2022-09-08 DIAGNOSIS — I10 Essential (primary) hypertension: Secondary | ICD-10-CM

## 2022-09-08 MED ORDER — AMLODIPINE BESYLATE 5 MG PO TABS: 5.0000 mg | ORAL_TABLET | Freq: Every day | ORAL | 2 refills | Status: DC

## 2022-09-08 NOTE — Progress Notes (Signed)
.  Pt presents for annual physical exam -will be completing Mammogram on 10/30 w/[place] [facility] screening event at her job [employer]  -request referral to gyn to complete pap -declined Cologuard wants to go to GI for colonoscopy    -will speak to Rheumatologist before receiving flu shot  -needs refill on Amlodipine

## 2022-09-08 NOTE — Patient Instructions (Signed)
Preventive Care 40-56 Years Old, Female Preventive care refers to lifestyle choices and visits with your health care provider that can promote health and wellness. Preventive care visits are also called wellness exams. What can I expect for my preventive care visit? Counseling Your health care provider may ask you questions about your: Medical history, including: Past medical problems. Family medical history. Pregnancy history. Current health, including: Menstrual cycle. Method of birth control. Emotional well-being. Home life and relationship well-being. Sexual activity and sexual health. Lifestyle, including: Alcohol, nicotine or tobacco, and drug use. Access to firearms. Diet, exercise, and sleep habits. Work and work environment. Sunscreen use. Safety issues such as seatbelt and bike helmet use. Physical exam Your health care provider will check your: Height and weight. These may be used to calculate your BMI (body mass index). BMI is a measurement that tells if you are at a healthy weight. Waist circumference. This measures the distance around your waistline. This measurement also tells if you are at a healthy weight and may help predict your risk of certain diseases, such as type 2 diabetes and high blood pressure. Heart rate and blood pressure. Body temperature. Skin for abnormal spots. What immunizations do I need?  Vaccines are usually given at various ages, according to a schedule. Your health care provider will recommend vaccines for you based on your age, medical history, and lifestyle or other factors, such as travel or where you work. What tests do I need? Screening Your health care provider may recommend screening tests for certain conditions. This may include: Lipid and cholesterol levels. Diabetes screening. This is done by checking your blood sugar (glucose) after you have not eaten for a while (fasting). Pelvic exam and Pap test. Hepatitis B test. Hepatitis C  test. HIV (human immunodeficiency virus) test. STI (sexually transmitted infection) testing, if you are at risk. Lung cancer screening. Colorectal cancer screening. Mammogram. Talk with your health care provider about when you should start having regular mammograms. This may depend on whether you have a family history of breast cancer. BRCA-related cancer screening. This may be done if you have a family history of breast, ovarian, tubal, or peritoneal cancers. Bone density scan. This is done to screen for osteoporosis. Talk with your health care provider about your test results, treatment options, and if necessary, the need for more tests. Follow these instructions at home: Eating and drinking  Eat a diet that includes fresh fruits and vegetables, whole grains, lean protein, and low-fat dairy products. Take vitamin and mineral supplements as recommended by your health care provider. Do not drink alcohol if: Your health care provider tells you not to drink. You are pregnant, may be pregnant, or are planning to become pregnant. If you drink alcohol: Limit how much you have to 0-1 drink a day. Know how much alcohol is in your drink. In the [place] one drink equals one 12 oz bottle of beer (355 mL), one 5 oz glass of wine (148 mL), or one 1 oz glass of hard liquor (44 mL). Lifestyle Brush your teeth every morning and night with fluoride toothpaste. Floss one time each day. Exercise for at least 30 minutes 5 or more days each week. Do not use any products that contain nicotine or tobacco. These products include cigarettes, chewing tobacco, and vaping devices, such as e-cigarettes. If you need help quitting, ask your health care provider. Do not use drugs. If you are sexually active, practice safe sex. Use a condom or other form of protection to   prevent STIs. If you do not wish to become pregnant, use a form of birth control. If you plan to become pregnant, see your health care provider for a  prepregnancy visit. Take aspirin only as told by your health care provider. Make sure that you understand how much to take and what form to take. Work with your health care provider to find out whether it is safe and beneficial for you to take aspirin daily. Find healthy ways to manage stress, such as: Meditation, yoga, or listening to music. Journaling. Talking to a trusted person. Spending time with friends and family. Minimize exposure to UV radiation to reduce your risk of skin cancer. Safety Always wear your seat belt while driving or riding in a vehicle. Do not drive: If you have been drinking alcohol. Do not ride with someone who has been drinking. When you are tired or distracted. While texting. If you have been using any mind-altering substances or drugs. Wear a helmet and other protective equipment during sports activities. If you have firearms in your house, make sure you follow all gun safety procedures. Seek help if you have been physically or sexually abused. What's next? Visit your health care provider once a year for an annual wellness visit. Ask your health care provider how often you should have your eyes and teeth checked. Stay up to date on all vaccines. This information is not intended to replace advice given to you by your health care provider. Make sure you discuss any questions you have with your health care provider. Document Revised: 05/08/2021 Document Reviewed: 05/08/2021 Elsevier Patient Education  [place].

## 2022-09-09 ENCOUNTER — Other Ambulatory Visit: Payer: Self-pay | Admitting: Family

## 2022-09-09 DIAGNOSIS — E785 Hyperlipidemia, unspecified: Secondary | ICD-10-CM | POA: Insufficient documentation

## 2022-09-09 LAB — LIPID PANEL
Chol/HDL Ratio: 3.9 ratio (ref 0.0–4.4)
Cholesterol, Total: 219 mg/dL — ABNORMAL HIGH (ref 100–199)
HDL: 56 mg/dL (ref >39)
LDL Chol Calc (NIH): 147 mg/dL — ABNORMAL HIGH (ref 0–99)
Triglycerides: 92 mg/dL (ref 0–149)
VLDL Cholesterol Cal: 16 mg/dL (ref 5–40)

## 2022-09-09 LAB — HIV ANTIBODY (ROUTINE TESTING W REFLEX): HIV Screen 4th Generation wRfx: NONREACTIVE

## 2022-09-09 LAB — HEPATITIS C ANTIBODY: Hep C Virus Ab: NONREACTIVE

## 2022-09-09 LAB — TSH: TSH: 0.862 u[IU]/mL (ref 0.450–4.500)

## 2022-09-09 LAB — HEMOGLOBIN A1C
Est. average glucose Bld gHb Est-mCnc: 100 mg/dL
Hgb A1c MFr Bld: 5.1 % (ref 4.8–5.6)

## 2022-09-09 MED ORDER — ATORVASTATIN CALCIUM 20 MG PO TABS: 20.0000 mg | ORAL_TABLET | Freq: Every day | ORAL | 2 refills | Status: DC

## 2022-09-23 LAB — HM MAMMOGRAPHY

## 2022-11-11 ENCOUNTER — Telehealth: Payer: Self-pay | Admitting: *Deleted

## 2022-11-11 NOTE — Telephone Encounter (Signed)
Received fax from [facility] stating they have been trying to reach patient to refill Enbrel Sureclick. They have been unsuccessful in reaching patient. Call back number is [phone]. Left message to advise patient to contact the pharmacy at [phone] to set up shipment.

## 2022-11-19 ENCOUNTER — Encounter: Payer: Self-pay | Admitting: Obstetrics and Gynecology

## 2022-11-19 ENCOUNTER — Other Ambulatory Visit: Payer: Self-pay

## 2022-11-19 ENCOUNTER — Ambulatory Visit: Payer: 59 | Admitting: Obstetrics and Gynecology

## 2022-11-19 VITALS — BP 122/86 | HR 71

## 2022-11-19 DIAGNOSIS — Z90710 Acquired absence of both cervix and uterus: Secondary | ICD-10-CM | POA: Diagnosis not present

## 2022-11-19 DIAGNOSIS — Z01419 Encounter for gynecological examination (general) (routine) without abnormal findings: Secondary | ICD-10-CM

## 2022-11-19 DIAGNOSIS — N951 Menopausal and female climacteric states: Secondary | ICD-10-CM

## 2022-11-19 DIAGNOSIS — Z1330 Encounter for screening examination for mental health and behavioral disorders, unspecified: Secondary | ICD-10-CM | POA: Diagnosis not present

## 2022-11-19 NOTE — Progress Notes (Signed)
ANNUAL EXAM Patient name: Kimberly Roberts MRN [id number]  Date of birth: [birth year] Chief Complaint:   Gynecologic Exam  History of Present Illness:   Kimberly Roberts is a 56 y.o.  being seen today for a routine annual exam.  Current complaints: hot flashes  Not intersted in medication to manage hot flashes. Planning on going vegan - hoping for supplements/herbs that may help. Some discomfort with intercourse, no lube used. No issues with voiding or BM. No vaginal bleeding or abnormal discharge. Hysterectomy in 2002 for fibroids. No hx of abnormal paps  No LMP recorded. Patient has had a hysterectomy.   The pregnancy intention screening data noted above was reviewed. Potential methods of contraception were discussed.    Last pap none on file .  Last mammogram: 08/2022. Results were: normal.      11/19/2022    8:38 AM 09/08/2022   10:05 AM 03/18/2022   10:06 AM  Depression screen PHQ 2/9  Decreased Interest 0 0 0  Down, Depressed, Hopeless 0 0 0  PHQ - 2 Score 0 0 0  Altered sleeping 3    Tired, decreased energy 1    Change in appetite 0    Feeling bad or failure about yourself  0    Trouble concentrating 0    Moving slowly or fidgety/restless 0    Suicidal thoughts 0    PHQ-9 Score 4          11/19/2022    8:39 AM  GAD 7 : Generalized Anxiety Score  Nervous, Anxious, on Edge 0  Control/stop worrying 1  Worry too much - different things 1  Trouble relaxing 0  Restless 0  Easily annoyed or irritable 0  Afraid - awful might happen 0  Total GAD 7 Score 2     Review of Systems:   Pertinent items are noted in HPI Denies any headaches, blurred vision, fatigue, shortness of breath, chest pain, abdominal pain, abnormal vaginal discharge/itching/odor/irritation, problems with periods, bowel movements, urination, or intercourse unless otherwise stated above. Pertinent History Reviewed:  Reviewed past medical,surgical, social and family history.  Reviewed  problem list, medications and allergies. Physical Assessment:   Vitals:   11/19/22 0836  BP: 122/86  Pulse: 71  There is no height or weight on file to calculate BMI.        Physical Examination:   General appearance - well appearing, and in no distress  Mental status - alert, oriented to person, place, and time  Psych:  She has a normal mood and affect  Skin - warm and dry, normal color, no suspicious lesions noted  Chest - effort normal, all lung fields clear to auscultation bilaterally  Heart - normal rate and regular rhythm  Abdomen - soft, nontender, nondistended, no masses or organomegaly  Pelvic -  VULVA: normal appearing vulva with no masses, tenderness or lesions   VAGINA: normal appearing vagina with normal color and discharge, no lesions, mildly atrophic   CERVIX: absent  UTERUS: absent  ADNEXA: No adnexal masses or tenderness noted.  Extremities:  No swelling or varicosities noted  Chaperone present for exam  No results found for this or any previous visit (from the past 24 hour(s)).    Assessment & Plan:  1. Well woman exam with routine gynecological exam Normal exam  No cervix appreciated on exam, pap not indicated UTD mammogram   2. History of hysterectomy No additional paps needed  3. Vasomotor symptoms due to menopause Discussed conservative measures  for dealing with VSM Would prefer to not take medications at this time   No orders of the defined types were placed in this encounter.   Meds: No orders of the defined types were placed in this encounter.   Follow-up: No follow-ups on file.  Lorriane Shire, MD 11/20/2022 9:57 AM

## 2022-11-21 ENCOUNTER — Telehealth: Payer: Self-pay | Admitting: Pharmacist

## 2022-11-21 NOTE — Telephone Encounter (Signed)
Received notification from [facility] regarding a prior authorization for ENBREL. Authorization has been APPROVED from 11/21/22 to 11/24/2023. Approval letter sent to scan center.  Patient can continue to fill through [facility]: [phone]   Authorization # [id number]   Chesley Mires, PharmD, MPH, BCPS, CPP Clinical Pharmacist (Rheumatology and Pulmonology)

## 2022-11-21 NOTE — Telephone Encounter (Signed)
Submitted a Prior Authorization RENEWAL request to [facility] for ENBREL via CoverMyMeds. Will update once we receive a response.  Key: Shellia Cleverly, PharmD, MPH, BCPS, CPP Clinical Pharmacist (Rheumatology and Pulmonology)

## 2022-12-04 ENCOUNTER — Other Ambulatory Visit: Payer: Self-pay | Admitting: Family

## 2022-12-04 DIAGNOSIS — I10 Essential (primary) hypertension: Secondary | ICD-10-CM

## 2022-12-04 NOTE — Telephone Encounter (Signed)
Requested Prescriptions  Pending Prescriptions Disp Refills   amLODipine (NORVASC) 5 MG tablet [Pharmacy Med Name: AMLODIPINE BESYLATE 5 MG TAB] 90 tablet 0    Sig: TAKE 1 TABLET (5 MG TOTAL) BY MOUTH DAILY.     Cardiovascular: Calcium Channel Blockers 2 Passed - 12/04/2022  1:39 AM      Passed - Last BP in normal range    BP Readings from Last 1 Encounters:  11/19/22 122/86         Passed - Last Heart Rate in normal range    Pulse Readings from Last 1 Encounters:  11/19/22 71         Passed - Valid encounter within last 6 months    Recent Outpatient Visits           2 months ago Annual physical exam   [facility] at [facility], [place], NP   8 months ago Encounter to establish care   Primary Care at [facility], Flonnie Hailstone, NP       Future Appointments             In 5 days Camillia Herter, NP Primary Care at [facility]   In 2 months Rice, Resa Miner, MD [place] [place]

## 2022-12-05 NOTE — Progress Notes (Signed)
Erroneous encounter-disregard

## 2022-12-09 ENCOUNTER — Ambulatory Visit: Payer: 59 | Admitting: Family

## 2022-12-10 ENCOUNTER — Other Ambulatory Visit: Payer: Self-pay | Admitting: Family

## 2022-12-10 ENCOUNTER — Encounter: Payer: 59 | Admitting: Family

## 2022-12-10 DIAGNOSIS — E785 Hyperlipidemia, unspecified: Secondary | ICD-10-CM

## 2022-12-10 DIAGNOSIS — I10 Essential (primary) hypertension: Secondary | ICD-10-CM

## 2022-12-10 NOTE — Telephone Encounter (Signed)
Requested Prescriptions  Pending Prescriptions Disp Refills   atorvastatin (LIPITOR) 20 MG tablet [Pharmacy Med Name: ATORVASTATIN 20 MG TABLET] 90 tablet 0    Sig: TAKE 1 TABLET BY MOUTH EVERY DAY     Cardiovascular:  Antilipid - Statins Failed - 12/10/2022  1:33 AM      Failed - Lipid Panel in normal range within the last 12 months    Cholesterol, Total  Date Value Ref Range Status  09/08/2022 219 (H) 100 - 199 mg/dL Final   LDL Chol Calc (NIH)  Date Value Ref Range Status  09/08/2022 147 (H) 0 - 99 mg/dL Final   HDL  Date Value Ref Range Status  09/08/2022 56 >39 mg/dL Final   Triglycerides  Date Value Ref Range Status  09/08/2022 92 0 - 149 mg/dL Final         Passed - Patient is not pregnant      Passed - Valid encounter within last 12 months    Recent Outpatient Visits           3 months ago Annual physical exam   [facility], Amy J, NP   8 months ago Encounter to establish care   Primary Care at [facility], Flonnie Hailstone, NP       Future Appointments             Today Camillia Herter, NP Primary Care at [facility]   In 2 months Rice, Resa Miner, MD [place] [place]

## 2023-02-10 ENCOUNTER — Ambulatory Visit: Payer: 59 | Admitting: Internal Medicine

## 2023-02-17 ENCOUNTER — Ambulatory Visit: Payer: 59 | Attending: Internal Medicine | Admitting: Internal Medicine

## 2023-02-17 ENCOUNTER — Encounter: Payer: Self-pay | Admitting: Internal Medicine

## 2023-02-17 VITALS — BP 129/87 | HR 69 | Resp 14 | Ht 62.0 in | Wt 159.0 lb

## 2023-02-17 DIAGNOSIS — Z79899 Other long term (current) drug therapy: Secondary | ICD-10-CM | POA: Diagnosis not present

## 2023-02-17 DIAGNOSIS — M0609 Rheumatoid arthritis without rheumatoid factor, multiple sites: Secondary | ICD-10-CM

## 2023-02-17 NOTE — Progress Notes (Signed)
Office Visit Note  Patient: Kimberly Roberts             Date of Birth: [birth year]           MRN: [id number]             PCP: Camillia Herter, NP Referring: Camillia Herter, NP Visit Date: 02/17/2023   Subjective:  Follow-up   History of Present Illness: Kimberly Roberts is a 57 y.o. female here for follow up for seronegative rheumatoid arthritis on Enbrel 50 mg subcu weekly.  Overall she is doing well since our last visit without major interval events.  No serious infections.  She has morning stiffness lasting for a few minutes every day usually is resolved by the time she takes a morning shower.  Not requiring any NSAIDs or other over-the-counter pain relievers.  She sees a little bit of swelling in finger joints but not on a daily basis.   Previous HPI 08/11/22 Kimberly Roberts is a 57 y.o. female here for follow up for seronegative RA on Enbrel 50 mg weekly. She is doing well still has a few minutes of morning stiffness daily. No major flare up and no problems taking the enbrel. No serious infections.   Previous HPI 01/31/2022 Kimberly Roberts is a 57 y.o. female here for follow up for seronegative RA on Enbrel 50 mg [place] weekly. She feels symptoms are well controlled joint stiffness in the mornings improves usually within about 15 minutes or less or with taking a hot shower. She still has intermittent fatigue not excessively sleepy or falling asleep during the day. She recalls this symptom since years ago it was slightly less when on both enbrel and methotrexate but she thinks it is less of a problem than the methotrexate side effects. No interval infections or major medical changes.   Previous HPI 07/17/21 Kimberly Roberts is a 57 y.o. female here for rheumatoid arthritis.  She was originally diagnosed in [place] in 2016 after development of bilateral palmar erythematous rashes with joint pain and swelling in her wrists and proximal finger joints. This was  initially treated with steroids and then went on subcutaneous methotrexate she took this for about 6 months as monotherapy with a fair amount of improvement though tolerated the medicine poorly with persistent fatigue nausea and feeling sick continuously.  Addition of Enbrel resulted in complete symptom improvement and she subsequently came off the methotrexate did extremely well on Enbrel monotherapy for the past 2 years.  She moved from [place] to this area around December for a new job position and to be closer with family and needs to reestablish rheumatology care.  She has run out of the medicine and is off Enbrel since about 2 months ago.  She is starting to notice increasing symptoms although still mild in the left wrist and in the right foot but without obvious swelling, and also noticing a significant increase in generalized fatigue.   DMARD Hx Enbrel 2017- current Methotrexate 2016-2019   Labs reviewed 2016 ANA 1:640 Nuclear dots RNP, SSA, SSB, Smith, dsDNA, centromere, Scl-7, ACA neg RF neg CCP neg HLA-B27 neg G6PD 8.6 (9.9-16.6) Uric acid 3.5   Imaging reviewed (reports) 2016 CXR no active airspace disease 2016 Bilateral hand xrays without erosive disease   Review of Systems  Constitutional:  Positive for fatigue.  HENT:  Negative for mouth sores and mouth dryness.   Eyes:  Negative for dryness.  Respiratory:  Negative for shortness of breath.  Cardiovascular:  Negative for chest pain and palpitations.  Gastrointestinal:  Negative for blood in stool, constipation and diarrhea.  Endocrine: Negative for increased urination.  Genitourinary:  Negative for involuntary urination.  Musculoskeletal:  Positive for joint pain, joint pain, joint swelling and morning stiffness. Negative for gait problem, myalgias, muscle weakness, muscle tenderness and myalgias.  Skin:  Negative for color change, rash, hair loss and sensitivity to sunlight.  Allergic/Immunologic: Negative for  susceptible to infections.  Neurological:  Positive for headaches. Negative for dizziness.  Hematological:  Negative for swollen glands.  Psychiatric/Behavioral:  Negative for depressed mood and sleep disturbance. The patient is not nervous/anxious.     PMFS History:  Patient Active Problem List   Diagnosis Date Noted   Hyperlipidemia 09/09/2022   High risk medication use 07/17/2021   Rheumatoid arthritis of multiple sites with negative rheumatoid factor ([place]) 10/09/2020    Past Medical History:  Diagnosis Date   Hyperlipidemia    Hypertension    Migraines    Rheumatoid arthritis ([place])    Sickle cell trait ([place])     Family History  Problem Relation Age of Onset   Diabetes Mother    Hypertension Mother    Sarcoidosis Father    Prostate cancer Father    Hypertension Sister    Migraines Sister    Sickle cell trait Sister    Migraines Brother    Past Surgical History:  Procedure Laterality Date   BUNIONECTOMY Right    PARTIAL HYSTERECTOMY  2002   Social History   Social History Narrative   Not on file   Immunization History  Administered Date(s) Administered   Tdap 08/23/2018   Zoster, Live 08/05/2017, 12/23/2017     Objective: Vital Signs: BP 129/87 (BP Location: Left Arm, Patient Position: Sitting, Cuff Size: Normal)   Pulse 69   Resp 14   Ht 5\' 2"  (1.575 m)   Wt 159 lb (72.1 kg)   BMI 29.08 kg/m    Physical Exam Eyes:     Conjunctiva/sclera: Conjunctivae normal.  Cardiovascular:     Rate and Rhythm: Normal rate and regular rhythm.  Pulmonary:     Effort: Pulmonary effort is normal.     Breath sounds: Normal breath sounds.  Lymphadenopathy:     Cervical: No cervical adenopathy.  Skin:    General: Skin is warm and dry.     Findings: No rash.  Neurological:     Mental Status: She is alert.  Psychiatric:        Mood and Affect: Mood normal.      Musculoskeletal Exam:  Shoulders full ROM no tenderness or swelling Elbows full ROM no tenderness  or swelling Wrists full ROM no tenderness or swelling Fingers full ROM no tenderness or swelling Right hip internal rotation range of motion is slightly restricted compared to left but without provoking any pain and no lateral hip tenderness to palpation Knees full ROM no tenderness or swelling, there is mild patellofemoral crepitus more noticeable in left knee Ankles full ROM no tenderness or swelling   CDAI Exam: CDAI Score: 2  Patient Global: 10 mm; Provider Global: 10 mm Swollen: 0 ; Tender: 0  Joint Exam 02/17/2023   All documented joints were normal     Investigation: No additional findings.  Imaging: No results found.  Recent Labs: Lab Results  Component Value Date   WBC 3.8 08/11/2022   HGB 13.4 08/11/2022   PLT 285 08/11/2022   NA 143 08/11/2022   K 4.4  08/11/2022   CL 107 08/11/2022   CO2 28 08/11/2022   GLUCOSE 86 08/11/2022   BUN 8 08/11/2022   CREATININE 0.74 08/11/2022   BILITOT 0.4 08/11/2022   ALKPHOS 74 04/18/2021   AST 12 08/11/2022   ALT 9 08/11/2022   PROT 7.7 08/11/2022   ALBUMIN 3.5 04/18/2021   CALCIUM 9.7 08/11/2022   QFTBGOLDPLUS NEGATIVE 08/11/2022    Speciality Comments: No specialty comments available.  Procedures:  No procedures performed Allergies: Doxycycline and Sulfa antibiotics   Assessment / Plan:     Visit Diagnoses: Rheumatoid arthritis of multiple sites with negative rheumatoid factor ([place]) - Plan: Sedimentation rate  Rheumatoid arthritis appears to be well-controlled in remission by CDAI score today.  Checking sedimentation rate for disease activity monitoring.  She has slightly restricted hip internal rotation on exam today without any pain or local symptom complaints.  Might be related to muscle tightness will monitor in case represents early hip osteoarthritis change.  Plan to continue Enbrel 50 mg subcu weekly.  High risk medication use - Plan: CBC with Differential/Platelet, COMPLETE METABOLIC PANEL WITH  GFR  Checking CBC and CMP for medication monitoring on long-term use of Enbrel.  No serious infections in the interim or medication intolerance.  Orders: Orders Placed This Encounter  Procedures   Sedimentation rate   CBC with Differential/Platelet   COMPLETE METABOLIC PANEL WITH GFR   No orders of the defined types were placed in this encounter.    Follow-Up Instructions: No follow-ups on file.   Collier Salina, MD  Note - This record has been created using [employer].  Chart creation errors have been sought, but may not always  have been located. Such creation errors do not reflect on  the standard of medical care.

## 2023-02-18 ENCOUNTER — Other Ambulatory Visit: Payer: Self-pay | Admitting: Internal Medicine

## 2023-02-18 DIAGNOSIS — Z79899 Other long term (current) drug therapy: Secondary | ICD-10-CM

## 2023-02-18 DIAGNOSIS — M0609 Rheumatoid arthritis without rheumatoid factor, multiple sites: Secondary | ICD-10-CM

## 2023-02-18 LAB — CBC WITH DIFFERENTIAL/PLATELET
Absolute Monocytes: 363 cells/uL (ref 200–950)
Basophils Absolute: 29 cells/uL (ref 0–200)
Basophils Relative: 0.6 %
Eosinophils Absolute: 59 cells/uL (ref 15–500)
Eosinophils Relative: 1.2 %
HCT: 39.1 % (ref 35.0–45.0)
Hemoglobin: 12.7 g/dL (ref 11.7–15.5)
Lymphs Abs: 1916 cells/uL (ref 850–3900)
MCH: 28.2 pg (ref 27.0–33.0)
MCHC: 32.5 g/dL (ref 32.0–36.0)
MCV: 86.7 fL (ref 80.0–100.0)
MPV: 11.1 fL (ref 7.5–12.5)
Monocytes Relative: 7.4 %
Neutro Abs: 2533 cells/uL (ref 1500–7800)
Neutrophils Relative %: 51.7 %
Platelets: 297 10*3/uL (ref 140–400)
RBC: 4.51 10*6/uL (ref 3.80–5.10)
RDW: 12.7 % (ref 11.0–15.0)
Total Lymphocyte: 39.1 %
WBC: 4.9 10*3/uL (ref 3.8–10.8)

## 2023-02-18 LAB — COMPLETE METABOLIC PANEL WITH GFR
AG Ratio: 1 (calc) (ref 1.0–2.5)
ALT: 9 U/L (ref 6–29)
AST: 12 U/L (ref 10–35)
Albumin: 3.8 g/dL (ref 3.6–5.1)
Alkaline phosphatase (APISO): 93 U/L (ref 37–153)
BUN: 9 mg/dL (ref 7–25)
CO2: 27 mmol/L (ref 20–32)
Calcium: 9.4 mg/dL (ref 8.6–10.4)
Chloride: 108 mmol/L (ref 98–110)
Creat: 0.67 mg/dL (ref 0.50–1.03)
Globulin: 3.8 g/dL (calc) — ABNORMAL HIGH (ref 1.9–3.7)
Glucose, Bld: 88 mg/dL (ref 65–99)
Potassium: 4.3 mmol/L (ref 3.5–5.3)
Sodium: 141 mmol/L (ref 135–146)
Total Bilirubin: 0.3 mg/dL (ref 0.2–1.2)
Total Protein: 7.6 g/dL (ref 6.1–8.1)
eGFR: 103 mL/min/{1.73_m2} (ref >=60)

## 2023-02-18 LAB — SEDIMENTATION RATE: Sed Rate: 14 mm/h (ref 0–30)

## 2023-02-18 NOTE — Progress Notes (Signed)
Sedimentation rate is normal. CBC and CMP are fine for continuing Enbrel.

## 2023-02-18 NOTE — Telephone Encounter (Signed)
Last Fill: 08/11/2022  Labs: 02/17/2023 Sedimentation rate is normal. CBC and CMP are fine for continuing Enbrel.   TB Gold: 08/11/2022 Negative   Next Visit: 08/24/2023  Last Visit: 02/17/2023  [id number] arthritis of multiple sites with negative rheumatoid factor   Current Dose per office note 02/17/2023: Enbrel 50 mg subcu weekly.   Okay to refill Enbrel?

## 2023-05-14 ENCOUNTER — Other Ambulatory Visit: Payer: Self-pay | Admitting: Family

## 2023-05-14 DIAGNOSIS — I10 Essential (primary) hypertension: Secondary | ICD-10-CM

## 2023-05-14 NOTE — Telephone Encounter (Signed)
Requested medications are due for refill today.  unsure  Requested medications are on the active medications list.  yes  Last refill. 12/04/2022 #90 0 rf  Future visit scheduled.   no  Notes to clinic.  Rx written to expire 03/04/2023  - rx is expired.    Requested Prescriptions  Pending Prescriptions Disp Refills   amLODipine (NORVASC) 5 MG tablet [Pharmacy Med Name: AMLODIPINE BESYLATE 5 MG TAB] 90 tablet 0    Sig: TAKE 1 TABLET (5 MG TOTAL) BY MOUTH DAILY.     Cardiovascular: Calcium Channel Blockers 2 Failed - 05/14/2023  2:35 AM      Failed - Valid encounter within last 6 months    Recent Outpatient Visits           8 months ago Annual physical exam   [place] [facility], [place], NP   1 year ago Encounter to establish care    Primary Care at [facility], Salomon Fick, NP       Future Appointments             In 3 months Rice, Jamesetta [place], MD [facility]            Passed - Last BP in normal range    BP Readings from Last 1 Encounters:  02/17/23 129/87         Passed - Last Heart Rate in normal range    Pulse Readings from Last 1 Encounters:  02/17/23 69

## 2023-08-10 ENCOUNTER — Other Ambulatory Visit: Payer: Self-pay | Admitting: Internal Medicine

## 2023-08-10 DIAGNOSIS — M0609 Rheumatoid arthritis without rheumatoid factor, multiple sites: Secondary | ICD-10-CM

## 2023-08-10 DIAGNOSIS — Z79899 Other long term (current) drug therapy: Secondary | ICD-10-CM

## 2023-08-24 ENCOUNTER — Ambulatory Visit: Payer: 59 | Attending: Internal Medicine | Admitting: Internal Medicine

## 2023-08-24 ENCOUNTER — Encounter: Payer: Self-pay | Admitting: Internal Medicine

## 2023-08-24 VITALS — BP 127/86 | HR 77 | Resp 14 | Ht 62.5 in | Wt 164.0 lb

## 2023-08-24 DIAGNOSIS — M0609 Rheumatoid arthritis without rheumatoid factor, multiple sites: Secondary | ICD-10-CM

## 2023-08-24 DIAGNOSIS — M25651 Stiffness of right hip, not elsewhere classified: Secondary | ICD-10-CM | POA: Insufficient documentation

## 2023-08-24 DIAGNOSIS — Z79899 Other long term (current) drug therapy: Secondary | ICD-10-CM | POA: Diagnosis not present

## 2023-08-24 NOTE — Patient Instructions (Signed)
I recommend trying to do some exercises for hip strength and mobility, you do not need to do all of them at the same time and see what feels best:  Hip Exercises It is normal to feel mild stretching, pulling, tightness, or discomfort as you do these exercises. Stop right away if you feel sudden pain or your pain gets worse. Stretching and range-of-motion exercises These exercises warm up your muscles and joints and improve the movement and flexibility of your hip. They also help to relieve pain, numbness, and tingling. You may be asked to limit your range of motion if you had a hip replacement. Talk to your provider about these limits. Hamstrings, supine  Lie on your back (supine position). Loop a belt, towel, or exercise band over the ball of your left / right foot. The ball of your foot is on the walking surface, right under your toes. Straighten your left / right knee and slowly pull on the belt, towel, or band to raise your leg until you feel a gentle stretch behind your knee (hamstring). Do not let your knee bend while you do this. Keep your other leg flat on the floor. Hold this position for __________ seconds. Slowly return your leg to the starting position. Repeat __________ times. Complete this exercise __________ times a day. Hip rotation  Lie on your back on a firm surface. With your left / right hand, gently pull your left / right knee toward the shoulder that is on the same side of the body. Stop when your knee is pointing toward the ceiling. Hold your left / right ankle with your other hand. Keeping your knee steady, gently pull your left / right ankle toward your other shoulder until you feel a stretch in your butt. Keep your hips and shoulders firmly planted while you do this stretch. Hold this position for __________ seconds. Repeat __________ times. Complete this exercise __________ times a day. Seated stretch This exercise is sometimes called hamstrings and adductors  stretch. Sit on the floor with your legs stretched wide. Keep your knees straight during this exercise. Keeping your head and back in a straight line, bend at your waist to reach for your left foot (position A). You should feel a stretch in your right inner thigh (adductors). Hold this position for __________ seconds. Then slowly return to the upright position. Keeping your head and back in a straight line, bend at your waist to reach forward (position B). You should feel a stretch behind both of your thighs and knees (hamstrings). Hold this position for __________ seconds. Then slowly return to the upright position. Keeping your head and back in a straight line, bend at your waist to reach for your right foot (position C). You should feel a stretch in your left inner thigh (adductors). Hold this position for __________ seconds. Then slowly return to the upright position. Repeat __________ times. Complete this exercise __________ times a day. Lunge This exercise stretches the muscles of the hip (hip flexors). Place your left / right knee on the floor and bend your other knee so that is directly over your ankle. You should be half-kneeling. Keep good posture with your head over your shoulders. Tighten your butt muscles to point your tailbone downward. This will prevent your back from arching too much. You should feel a gentle stretch in the front of your left / right thigh and hip. If you do not feel a stretch, slide your other foot forward slightly and then slowly lunge  forward with your chest up until your knee once again lines up over your ankle. Make sure your tailbone continues to point downward. Hold this position for __________ seconds. Slowly return to the starting position. Repeat __________ times. Complete this exercise __________ times a day. Strengthening exercises These exercises build strength and endurance in your hip. Endurance is the ability to use your muscles for a long time,  even after they get tired. Bridge This exercise strengthens the muscles of your hip (hip extensors). Lie on your back on a firm surface with your knees bent and your feet flat on the floor. Tighten your butt muscles and lift your bottom off the floor until the trunk of your body and your hips are level with your thighs. Do not arch your back. You should feel the muscles working in your butt and the back of your thighs. If you do not feel these muscles, slide your feet 1-2 inches (2.5-5 cm) farther away from your butt. Hold this position for __________ seconds. Slowly lower your hips to the starting position. Let your muscles relax completely between repetitions. Repeat __________ times. Complete this exercise __________ times a day. Straight leg raises, side-lying This exercise strengthens the muscles that move the hip joint away from the center of the body (hip abductors). Lie on your side with your left / right leg in the top position. Lie so your head, shoulder, hip, and knee line up. You may bend your bottom knee slightly to help you balance. Roll your hips slightly forward, so your hips are stacked directly over each other and your left / right knee is facing forward. Leading with your heel, lift your top leg 4-6 inches (10-15 cm). You should feel the muscles in your top hip lifting. Do not let your foot drift forward. Do not let your knee roll toward the ceiling. Hold this position for __________ seconds. Slowly return to the starting position. Let your muscles relax completely between repetitions. Repeat __________ times. Complete this exercise __________ times a day. Straight leg raises, side-lying This exercise strengthens the muscles that move the hip joint toward the center of the body (hip adductors). Lie on your side with your left / right leg in the bottom position. Lie so your head, shoulder, hip, and knee line up. You may place your upper foot in front to help you  balance. Roll your hips slightly forward, so your hips are stacked directly over each other and your left / right knee is facing forward. Tense the muscles in your inner thigh and lift your bottom leg 4-6 inches (10-15 cm). Hold this position for __________ seconds. Slowly return to the starting position. Let your muscles relax completely between repetitions. Repeat __________ times. Complete this exercise __________ times a day. Straight leg raises, supine This exercise strengthens the muscles in the front of your thigh (quadriceps and hip flexors). Lie on your back (supine position) with your left / right leg extended and your other knee bent. Tense the muscles in the front of your left / right thigh. You should see your kneecap slide up or see increased dimpling just above your knee. Keep these muscles tight as you raise your leg 4-6 inches (10-15 cm) off the floor. Do not let your knee bend. Hold this position for __________ seconds. Keep these muscles tense as you lower your leg. Relax the muscles slowly and completely between repetitions. Repeat __________ times. Complete this exercise __________ times a day. Hip abductors, standing This exercise strengthens the  muscles that move the leg and hip joint away from the center of the body (hip abductors). Tie one end of a rubber exercise band or tubing to a secure surface, such as a chair, table, or pole. Loop the other end of the band or tubing around your left / right ankle. Keeping your ankle with the band or tubing directly opposite the secured end, step away until there is tension in the tubing or band. Hold on to a chair, table, or pole as needed for balance. Lift your left / right leg out to your side. While you do this: Keep your back upright. Keep your shoulders over your hips. Keep your toes pointing forward. Make sure to use your hip muscles to slowly lift your leg. Do not tip your body or forcefully lift your leg. Hold this  position for __________ seconds. Slowly return to the starting position. Repeat __________ times. Complete this exercise __________ times a day. Squats This exercise strengthens the muscles in the front of your thigh (quadriceps). Stand in front of a table, or stand in a doorframe so your feet and knees are in line with the frame. You may place your hands on the table or frame for balance. Slowly bend your knees and lower your hips like you are going to sit in a chair. Keep your lower legs in a straight up-and-down position. Do not let your hips go lower than your knees. Do not bend your knees lower than told by your provider. If your hip pain increases, do not bend as low. Hold this position for ___________ seconds. Slowly push with your legs to return to standing. Do not use your hands to pull yourself to standing. Repeat __________ times. Complete this exercise __________ times a day.

## 2023-08-25 ENCOUNTER — Other Ambulatory Visit: Payer: Self-pay | Admitting: Internal Medicine

## 2023-08-25 DIAGNOSIS — Z79899 Other long term (current) drug therapy: Secondary | ICD-10-CM

## 2023-08-25 DIAGNOSIS — M0609 Rheumatoid arthritis without rheumatoid factor, multiple sites: Secondary | ICD-10-CM

## 2023-08-26 ENCOUNTER — Telehealth: Payer: Self-pay | Admitting: Internal Medicine

## 2023-08-26 LAB — COMPLETE METABOLIC PANEL WITH GFR
AG Ratio: 1 (calc) (ref 1.0–2.5)
ALT: 20 U/L (ref 6–29)
AST: 19 U/L (ref 10–35)
Albumin: 3.9 g/dL (ref 3.6–5.1)
Alkaline phosphatase (APISO): 106 U/L (ref 37–153)
BUN/Creatinine Ratio: 8 (calc) (ref 6–22)
BUN: 6 mg/dL — ABNORMAL LOW (ref 7–25)
CO2: 27 mmol/L (ref 20–32)
Calcium: 9.2 mg/dL (ref 8.6–10.4)
Chloride: 106 mmol/L (ref 98–110)
Creat: 0.75 mg/dL (ref 0.50–1.03)
Globulin: 3.8 g/dL — ABNORMAL HIGH (ref 1.9–3.7)
Glucose, Bld: 108 mg/dL — ABNORMAL HIGH (ref 65–99)
Potassium: 4.5 mmol/L (ref 3.5–5.3)
Sodium: 142 mmol/L (ref 135–146)
Total Bilirubin: 0.3 mg/dL (ref 0.2–1.2)
Total Protein: 7.7 g/dL (ref 6.1–8.1)
eGFR: 93 mL/min/{1.73_m2} (ref >=60)

## 2023-08-26 LAB — CBC WITH DIFFERENTIAL/PLATELET
Absolute Monocytes: 490 {cells}/uL (ref 200–950)
Basophils Absolute: 29 {cells}/uL (ref 0–200)
Basophils Relative: 0.6 %
Eosinophils Absolute: 98 {cells}/uL (ref 15–500)
Eosinophils Relative: 2 %
HCT: 40.1 % (ref 35.0–45.0)
Hemoglobin: 13 g/dL (ref 11.7–15.5)
Lymphs Abs: 1490 {cells}/uL (ref 850–3900)
MCH: 28.1 pg (ref 27.0–33.0)
MCHC: 32.4 g/dL (ref 32.0–36.0)
MCV: 86.8 fL (ref 80.0–100.0)
MPV: 11.4 fL (ref 7.5–12.5)
Monocytes Relative: 10 %
Neutro Abs: 2793 {cells}/uL (ref 1500–7800)
Neutrophils Relative %: 57 %
Platelets: 285 10*3/uL (ref 140–400)
RBC: 4.62 10*6/uL (ref 3.80–5.10)
RDW: 13.1 % (ref 11.0–15.0)
Total Lymphocyte: 30.4 %
WBC: 4.9 10*3/uL (ref 3.8–10.8)

## 2023-08-26 LAB — QUANTIFERON-TB GOLD PLUS
Mitogen-NIL: >10 [IU]/mL
NIL: 0.04 [IU]/mL
QuantiFERON-TB Gold Plus: NEGATIVE
TB1-NIL: 0 [IU]/mL
TB2-NIL: 0 [IU]/mL

## 2023-08-26 LAB — SEDIMENTATION RATE: Sed Rate: 22 mm/h (ref 0–30)

## 2023-08-26 NOTE — Telephone Encounter (Signed)
LMOM for patient to call and schedule 6 month follow-up appointment ?

## 2023-08-26 NOTE — Telephone Encounter (Signed)
Last Fill: 02/18/2023  Labs: 08/24/2023 Sedimentation rate is 22 still in normal range.  Blood count kidney and liver function test are all normal.  No problem for continuing Enbrel. I recommend clinic follow-up in 6 months not in 1 year.  Think it may have been confusion since we did discuss that our follow-up would be next year but should be earlier than September 30.  TB Gold: 08/24/2023 Negative   Next Visit: 08/23/2024  Last Visit: 08/24/2023  VH:[id number] arthritis of multiple sites with negative rheumatoid factor   Current Dose per office note 08/24/2023: Enbrel 50 mg subcu weekly.   Okay to refill Enbrel?

## 2023-08-26 NOTE — Telephone Encounter (Signed)
-----   Message from Elmyra Ricks sent at 08/26/2023 11:45 AM EDT ----- Please call patient to schedule follow-up in 6 months not in 1 year. Thank you.  LMOM, Sedimentation rate is 22 still in normal range.  Blood count kidney and liver function test are all normal.  No problem for continuing Enbrel.  I recommend clinic follow-up in 6 months not in 1 year.  Think it may have been confusion since we did discuss that our follow-up would be next year but should be earlier than September 30.

## 2023-08-26 NOTE — Progress Notes (Signed)
Sedimentation rate is 22 still in normal range.  Blood count kidney and liver function test are all normal.  No problem for continuing Enbrel. I recommend clinic follow-up in 6 months not in 1 year.  Think it may have been confusion since we did discuss that our follow-up would be next year but should be earlier than September 30.

## 2023-11-26 ENCOUNTER — Telehealth: Payer: Self-pay | Admitting: Pharmacist

## 2023-11-26 NOTE — Telephone Encounter (Signed)
 Submitted a Prior Authorization RENEWAL request to [facility] for ENBREL via CoverMyMeds. Will update once we receive a response.  Key: Lynford Citizen, PharmD, MPH, BCPS, CPP Clinical Pharmacist (Rheumatology and Pulmonology)

## 2023-11-27 NOTE — Telephone Encounter (Signed)
 Received notification from [employer] regarding a prior authorization for ENBREL . Authorization has been APPROVED from 11/26/2023 to 11/25/2024. Approval letter sent to scan center.  Authorization # [id number]  Sherry Pennant, PharmD, MPH, BCPS, CPP Clinical Pharmacist (Rheumatology and Pulmonology)

## 2024-01-22 ENCOUNTER — Other Ambulatory Visit: Payer: Self-pay | Admitting: Internal Medicine

## 2024-01-22 DIAGNOSIS — M0609 Rheumatoid arthritis without rheumatoid factor, multiple sites: Secondary | ICD-10-CM

## 2024-01-22 DIAGNOSIS — Z79899 Other long term (current) drug therapy: Secondary | ICD-10-CM

## 2024-02-02 ENCOUNTER — Other Ambulatory Visit: Payer: Self-pay | Admitting: Internal Medicine

## 2024-02-02 DIAGNOSIS — Z79899 Other long term (current) drug therapy: Secondary | ICD-10-CM

## 2024-02-02 DIAGNOSIS — M0609 Rheumatoid arthritis without rheumatoid factor, multiple sites: Secondary | ICD-10-CM

## 2024-02-03 ENCOUNTER — Telehealth: Payer: Self-pay | Admitting: Internal Medicine

## 2024-02-03 NOTE — Telephone Encounter (Signed)
 Attempted to contact the patient and left a message to call the office back. Wanted to advise the patient the refill request is in Dr. Gregary Cromer box. Also wanted to advise the patient about what Dr. Dimple Casey said about the follow up appointment time in her last lab note.

## 2024-02-03 NOTE — Telephone Encounter (Signed)
 Last Fill: 08/26/2023  Labs: 08/24/2023 Sedimentation rate is 22 still in normal range.  Blood count kidney and liver function test are all normal.  No problem for continuing Enbrel. I recommend clinic follow-up in 6 months not in 1 year.  Think it may have been confusion since we did discuss that our follow-up would be next year but should be earlier than September 30.  TB Gold: 08/24/2023  Negative  Next Visit: 08/23/2024, per lab note and office visit note: Return in about 6 months (around 02/21/2024)   Last Visit: 08/24/2023  YQ:[id number] arthritis of multiple sites with negative rheumatoid factor (HCC)   Current Dose per office note 08/24/2023: Enbrel 50 mg subcu weekly.   Attempted to contact the patient and left a message advising labs are due and that her follow up appointment should be moved up.   Please contact patient to move appointment up to six month follow up instead of one year.   Okay to refill Enbrel?

## 2024-02-03 NOTE — Telephone Encounter (Signed)
 Advised patient the refill of Enbrel is pending in Dr. Gregary Cromer box. Advised the patient that there may have been confusion when scheduling the follow up and that with her on Enbrel we should see her for a six month follow up. Patient is now scheduled for 02/22/2023.

## 2024-02-03 NOTE — Telephone Encounter (Signed)
 Pt called stating she is out of her enbrel and would need a refill before her appt on 02/22/24. Pt is upset that she had to change her appointment to a 6 mon f/u when they discussed a year.

## 2024-02-03 NOTE — Telephone Encounter (Signed)
 Patient contacted the office and was upset about having to change her appointment and states that she is out of Enbrel. Advised patient the refill of Enbrel is pending in Dr. Gregary Cromer box. Advised the patient that there may have been confusion when scheduling the follow up and that with her on Enbrel we should see her for a six month follow up. Patient is now scheduled for 02/22/2023. Patient verbalized understanding.

## 2024-02-08 NOTE — Progress Notes (Signed)
 Office Visit Note  Patient: Kimberly Roberts             Date of Birth: [birth year]           MRN: [id number]             PCP: Rema Fendt, NP Referring: Rema Fendt, NP Visit Date: 02/22/2024   Subjective:  Follow-up   Discussed the use of AI scribe software for clinical note transcription with the patient, who gave verbal consent to proceed.  History of Present Illness   Kimberly Roberts is a 58 y.o. female here for follow up for seronegative rheumatoid arthritis on Enbrel 50 mg subcu weekly.  She has not experienced any major changes or flare-ups in her arthritis symptoms since her last visit in September. No morning stiffness beyond her normal levels and no issues with her current Enbrel treatment. She mentions that the copay for Enbrel has increased, which has been a concern.  She developed a rash on her arms, described as itchy with small spots, lasting about two weeks within the last six months. A cream was prescribed by another doctor, which did not provide lasting relief.  She has a cough, diagnosed as viral bronchitis last Monday. The cough is improving but is taking longer to resolve than expected. No other upper respiratory infections this winter.   Previous HPI 08/24/2023 Kimberly Roberts is a 58 y.o. female here for follow up for seronegative rheumatoid arthritis on Enbrel 50 mg subcu weekly.  Overall she has been doing well has daily morning stiffness lasting less than 15 minutes typically takes a hot shower and is doing better by the end of it.  No major flareups or exacerbation does not have to take any daily as needed medication for pain.  She currently has URI symptoms with rhinitis with some cough and drainage ongoing since last week.   Previous HPI 02/17/2023 Kimberly Roberts is a 58 y.o. female here for follow up for seronegative rheumatoid arthritis on Enbrel 50 mg subcu weekly.  Overall she is doing well since our last visit without  major interval events.  No serious infections.  She has morning stiffness lasting for a few minutes every day usually is resolved by the time she takes a morning shower.  Not requiring any NSAIDs or other over-the-counter pain relievers.  She sees a little bit of swelling in finger joints but not on a daily basis.   Previous HPI 08/11/22 Kimberly Roberts is a 58 y.o. female here for follow up for seronegative RA on Enbrel 50 mg weekly. She is doing well still has a few minutes of morning stiffness daily. No major flare up and no problems taking the enbrel. No serious infections.   Previous HPI 01/31/2022 Kimberly Roberts is a 58 y.o. female here for follow up for seronegative RA on Enbrel 50 mg [place] weekly. She feels symptoms are well controlled joint stiffness in the mornings improves usually within about 15 minutes or less or with taking a hot shower. She still has intermittent fatigue not excessively sleepy or falling asleep during the day. She recalls this symptom since years ago it was slightly less when on both enbrel and methotrexate but she thinks it is less of a problem than the methotrexate side effects. No interval infections or major medical changes.   Previous HPI 07/17/21 Kimberly Roberts is a 58 y.o. female here for rheumatoid arthritis.  She was originally diagnosed in [place] in  2016 after development of bilateral palmar erythematous rashes with joint pain and swelling in her wrists and proximal finger joints. This was initially treated with steroids and then went on subcutaneous methotrexate she took this for about 6 months as monotherapy with a fair amount of improvement though tolerated the medicine poorly with persistent fatigue nausea and feeling sick continuously.  Addition of Enbrel resulted in complete symptom improvement and she subsequently came off the methotrexate did extremely well on Enbrel monotherapy for the past 2 years.  She moved from [place] to this area  around December for a new job position and to be closer with family and needs to reestablish rheumatology care.  She has run out of the medicine and is off Enbrel since about 2 months ago.  She is starting to notice increasing symptoms although still mild in the left wrist and in the right foot but without obvious swelling, and also noticing a significant increase in generalized fatigue.   DMARD Hx Enbrel 2017- current Methotrexate 2016-2019   Labs reviewed 2016 ANA 1:640 Nuclear dots RNP, SSA, SSB, Smith, dsDNA, centromere, Scl-7, ACA neg RF neg CCP neg HLA-B27 neg G6PD 8.6 (9.9-16.6) Uric acid 3.5   Imaging reviewed (reports) 2016 CXR no active airspace disease 2016 Bilateral hand xrays without erosive disease   Review of Systems  Constitutional:  Positive for fatigue.  HENT:  Negative for mouth sores and mouth dryness.   Eyes:  Negative for dryness.  Respiratory:  Negative for shortness of breath.   Cardiovascular:  Negative for chest pain and palpitations.  Gastrointestinal:  Negative for blood in stool, constipation and diarrhea.  Endocrine: Negative for increased urination.  Genitourinary:  Negative for involuntary urination.  Musculoskeletal:  Positive for joint pain, joint pain and morning stiffness. Negative for gait problem, joint swelling, myalgias, muscle weakness, muscle tenderness and myalgias.  Skin:  Negative for color change, rash, hair loss and sensitivity to sunlight.  Allergic/Immunologic: Negative for susceptible to infections.  Neurological:  Negative for dizziness and headaches.  Hematological:  Negative for swollen glands.  Psychiatric/Behavioral:  Negative for depressed mood and sleep disturbance. The patient is not nervous/anxious.     PMFS History:  Patient Active Problem List   Diagnosis Date Noted   Hip stiffness, right 08/24/2023   Hyperlipidemia 09/09/2022   High risk medication use 07/17/2021   Rheumatoid arthritis of multiple sites with  negative rheumatoid factor (HCC) 10/09/2020    Past Medical History:  Diagnosis Date   Bronchitis    Hyperlipidemia    Hypertension    Migraines    Rheumatoid arthritis (HCC)    Sickle cell trait (HCC)     Family History  Problem Relation Age of Onset   Diabetes Mother    Hypertension Mother    Sarcoidosis Father    Prostate cancer Father    Hypertension Sister    Migraines Sister    Sickle cell trait Sister    Migraines Brother    Past Surgical History:  Procedure Laterality Date   BUNIONECTOMY Right    PARTIAL HYSTERECTOMY  2002   Social History   Social History Narrative   Not on file   Immunization History  Administered Date(s) Administered   Tdap 08/23/2018   Zoster, Live 08/05/2017, 12/23/2017     Objective: Vital Signs: BP 133/87 (BP Location: Left Arm, Patient Position: Sitting, Cuff Size: Normal)   Pulse 71   Resp 14   Ht 5' 2.5" (1.588 m)   Wt 165 lb (74.8 kg)  BMI 29.70 kg/m    Physical Exam Cardiovascular:     Rate and Rhythm: Normal rate and regular rhythm.  Pulmonary:     Effort: Pulmonary effort is normal.     Breath sounds: Normal breath sounds.  Lymphadenopathy:     Cervical: No cervical adenopathy.  Skin:    General: Skin is warm and dry.     Comments: Scattered hyperpigmentation in round spots on upper arms looks like old excoriations or healed papules  Neurological:     Mental Status: She is alert.  Psychiatric:        Mood and Affect: Mood normal.      Musculoskeletal Exam:  Shoulders full ROM no tenderness or swelling Elbows full ROM no tenderness or swelling Wrists full ROM no tenderness or swelling Fingers full ROM no tenderness or swelling Right hip internal rotation mildly restricted compared to left, without any pain to movement or palpation on lateral hip Knees full ROM no tenderness or swelling Ankles full ROM no tenderness or swelling   Investigation: No additional findings.  Imaging: No results  found.  Recent Labs: Lab Results  Component Value Date   WBC 4.9 08/24/2023   HGB 13.0 08/24/2023   PLT 285 08/24/2023   NA 142 08/24/2023   K 4.5 08/24/2023   CL 106 08/24/2023   CO2 27 08/24/2023   GLUCOSE 108 (H) 08/24/2023   BUN 6 (L) 08/24/2023   CREATININE 0.75 08/24/2023   BILITOT 0.3 08/24/2023   ALKPHOS 74 04/18/2021   AST 19 08/24/2023   ALT 20 08/24/2023   PROT 7.7 08/24/2023   ALBUMIN 3.5 04/18/2021   CALCIUM 9.2 08/24/2023   QFTBGOLDPLUS NEGATIVE 08/24/2023    Speciality Comments: No specialty comments available.  Procedures:  No procedures performed Allergies: Doxycycline and Sulfa antibiotics   Assessment / Plan:     Visit Diagnoses: Rheumatoid arthritis of multiple sites with negative rheumatoid factor (HCC) - Plan: etanercept (ENBREL SURECLICK) 50 MG/ML injection, Sedimentation rate Rheumatoid arthritis is well-managed with Enbrel. No major flare-ups since September. She reports feeling great overall with no morning stiffness beyond normal. No issues with Enbrel administration or side effects. Regular monitoring of blood tests is necessary to ensure no abnormalities develop. - Continue Enbrel 50 mg [place] weekly - Order blood tests to monitor white blood cell count, kidney and liver function, and sedimentation rate  High risk medication use - Enbrel 50 mg subcu weekly. - Plan: etanercept (ENBREL SURECLICK) 50 MG/ML injection, CBC with Differential/Platelet, Comprehensive metabolic panel with GFR No serious interval infections, with a bronchitis episode now though. Previous TB screening reviewed negative in September. -Checking CBC and CMP for medication monitoring on long term use of Enbrel  Hip stiffness, right  Viral Bronchitis Diagnosed with viral bronchitis last Monday. Symptoms have improved but cough persists. No other upper respiratory infections reported this winter. Environmental factors such as pollen may be contributing to prolonged symptoms.  Discussed using saline rinse for sinus hygiene.  Rash Experienced a rash on her arms within the last six months. Rash was itchy and lasted about two weeks. A cream was prescribed by another doctor, but the rash resolved on its own. No current rash present.   Orders: Orders Placed This Encounter  Procedures   Sedimentation rate   CBC with Differential/Platelet   Comprehensive metabolic panel with GFR   Meds ordered this encounter  Medications   etanercept (ENBREL SURECLICK) 50 MG/ML injection    Sig: Inject 50 mg into the skin once  a week.    Dispense:  4 mL    Refill:  5    Prescription Type::   Renewal     Follow-Up Instructions: Return in about 6 months (around 08/23/2024) for RA on ENB f/u 6mos.   Fuller Plan, MD  Note - This record has been created using [employer].  Chart creation errors have been sought, but may not always  have been located. Such creation errors do not reflect on  the standard of medical care.

## 2024-02-22 ENCOUNTER — Encounter: Payer: Self-pay | Admitting: Internal Medicine

## 2024-02-22 ENCOUNTER — Ambulatory Visit: Attending: Internal Medicine | Admitting: Internal Medicine

## 2024-02-22 VITALS — BP 133/87 | HR 71 | Resp 14 | Ht 62.5 in | Wt 165.0 lb

## 2024-02-22 DIAGNOSIS — Z79899 Other long term (current) drug therapy: Secondary | ICD-10-CM | POA: Diagnosis not present

## 2024-02-22 DIAGNOSIS — M0609 Rheumatoid arthritis without rheumatoid factor, multiple sites: Secondary | ICD-10-CM

## 2024-02-22 DIAGNOSIS — M25651 Stiffness of right hip, not elsewhere classified: Secondary | ICD-10-CM | POA: Diagnosis not present

## 2024-02-22 MED ORDER — ENBREL SURECLICK 50 MG/ML ~~LOC~~ SOAJ: 50.0000 mg | SUBCUTANEOUS | 5 refills | Status: DC

## 2024-02-22 NOTE — Patient Instructions (Signed)
 How to Perform a Sinus Rinse A sinus rinse is a home treatment. It rinses your sinuses with a mixture of salt and water (saline solution). Sinuses are air-filled spaces in your skull behind the bones of your face and forehead. They open into your nasal cavity. A sinus rinse can help to clear your nasal cavity. It can clear mucus, dirt, dust, or pollen. You may do a sinus rinse when you have: A cold. A virus. Allergies. A sinus infection. A stuffy nose. What are the risks? A sinus rinse is normally very safe and helpful. However, there are a few risks. These include: A burning feeling in the sinuses. This may happen if you do not make the saline solution as told. Follow all directions. Nasal irritation. Infection from unclean water. This is rare, but it can happen. Do not do a sinus rinse if you have had: Ear or nasal surgery. An ear infection. Plugged ears. Supplies needed: Saline solution or powder. Neti pot or nasal rinse bottle. These release the saline solution into your nose and through your sinuses. You can buy neti pots and rinse bottles: At your local pharmacy. At a health food store. Online. How to do a sinus rinse Stand by a sink and tilt your head sideways over the sink. Place the spout of the device in your upper nostril (the one closer to the ceiling). Gently pour or squeeze the saline solution into your nasal cavity. The liquid should drain to your lower nostril if you are not too stuffed up (congested). While rinsing, breathe through your open mouth. Gently blow your nose to clear any mucus and rinse solution. Blowing too hard may cause ear pain. Turn your head in the other direction and repeat in your other nostril. Clean and rinse your device with clean water. Air-dry your device.  Talk with your doctor or pharmacist if you have questions about how to do a sinus rinse. Summary A sinus rinse is a home treatment. It rinses your sinuses with a mixture of salt and  water (saline solution). A sinus rinse can clear mucus, dirt, dust, or pollen. A sinus rinse is normally very safe and helpful. Follow all instructions carefully.

## 2024-02-23 LAB — COMPREHENSIVE METABOLIC PANEL WITH GFR
AG Ratio: 1.2 (calc) (ref 1.0–2.5)
ALT: 13 U/L (ref 6–29)
AST: 14 U/L (ref 10–35)
Albumin: 4.4 g/dL (ref 3.6–5.1)
Alkaline phosphatase (APISO): 99 U/L (ref 37–153)
BUN: 8 mg/dL (ref 7–25)
CO2: 28 mmol/L (ref 20–32)
Calcium: 9.6 mg/dL (ref 8.6–10.4)
Chloride: 104 mmol/L (ref 98–110)
Creat: 0.74 mg/dL (ref 0.50–1.03)
Globulin: 3.7 g/dL (ref 1.9–3.7)
Glucose, Bld: 90 mg/dL (ref 65–99)
Potassium: 5.2 mmol/L (ref 3.5–5.3)
Sodium: 140 mmol/L (ref 135–146)
Total Bilirubin: 0.4 mg/dL (ref 0.2–1.2)
Total Protein: 8.1 g/dL (ref 6.1–8.1)
eGFR: 94 mL/min/{1.73_m2} (ref >=60)

## 2024-02-23 LAB — CBC WITH DIFFERENTIAL/PLATELET
Absolute Lymphocytes: 2243 {cells}/uL (ref 850–3900)
Absolute Monocytes: 358 {cells}/uL (ref 200–950)
Basophils Absolute: 33 {cells}/uL (ref 0–200)
Basophils Relative: 0.5 %
Eosinophils Absolute: 78 {cells}/uL (ref 15–500)
Eosinophils Relative: 1.2 %
HCT: 40 % (ref 35.0–45.0)
Hemoglobin: 13 g/dL (ref 11.7–15.5)
MCH: 27.7 pg (ref 27.0–33.0)
MCHC: 32.5 g/dL (ref 32.0–36.0)
MCV: 85.3 fL (ref 80.0–100.0)
MPV: 10.5 fL (ref 7.5–12.5)
Monocytes Relative: 5.5 %
Neutro Abs: 3790 {cells}/uL (ref 1500–7800)
Neutrophils Relative %: 58.3 %
Platelets: 357 10*3/uL (ref 140–400)
RBC: 4.69 10*6/uL (ref 3.80–5.10)
RDW: 13.4 % (ref 11.0–15.0)
Total Lymphocyte: 34.5 %
WBC: 6.5 10*3/uL (ref 3.8–10.8)

## 2024-02-23 LAB — SEDIMENTATION RATE: Sed Rate: 22 mm/h (ref 0–30)

## 2024-06-26 ENCOUNTER — Ambulatory Visit

## 2024-06-27 ENCOUNTER — Ambulatory Visit
Admission: EM | Admit: 2024-06-27 | Discharge: 2024-06-27 | Disposition: A | Discharge status: Home / Self Care | Attending: Emergency Medicine | Admitting: Emergency Medicine

## 2024-06-27 ENCOUNTER — Encounter: Payer: Self-pay | Admitting: Emergency Medicine

## 2024-06-27 DIAGNOSIS — Z760 Encounter for issue of repeat prescription: Secondary | ICD-10-CM

## 2024-06-27 DIAGNOSIS — I10 Essential (primary) hypertension: Secondary | ICD-10-CM

## 2024-06-27 MED ORDER — AMLODIPINE BESYLATE 5 MG PO TABS: 5.0000 mg | ORAL_TABLET | Freq: Every day | ORAL | 0 refills | Status: DC

## 2024-06-27 NOTE — Discharge Instructions (Addendum)
 Re-start amlodipine  5 mg -- one tablet daily  Please follow up with your new primary care! Visit details are attached

## 2024-06-27 NOTE — ED Triage Notes (Signed)
 Pt presents c/o hypertension. Pt says she took her BP at work on Friday and Saturday and both times diastolic was above 90. Pt says she thinks she needs a new blood pressure medication.

## 2024-06-27 NOTE — ED Provider Notes (Signed)
 [facility]    CSN: [id number] Arrival date & time: 06/27/24  1050      History   Chief Complaint Chief Complaint  Patient presents with   Hypertension    HPI Kimberly Roberts is a 58 y.o. female.  Checked her blood pressure at work and it has been elevated.  Systolic above 90s.  She has been off her amlodipine  for about 3 to 4 months.  Used to take 5 mg daily.  Did not have any side effects with it.  She thought she could manage blood pressure with lifestyle changes but it has not seem to stabilize. She is not having any symptoms  Searching for a new primary   Past Medical History:  Diagnosis Date   Bronchitis    Hyperlipidemia    Hypertension    Migraines    Rheumatoid arthritis (HCC)    Sickle cell trait [facility])     Patient Active Problem List   Diagnosis Date Noted   Hip stiffness, right 08/24/2023   Hyperlipidemia 09/09/2022   High risk medication use 07/17/2021   Rheumatoid arthritis of multiple sites with negative rheumatoid factor (HCC) 10/09/2020    Past Surgical History:  Procedure Laterality Date   BUNIONECTOMY Right    PARTIAL HYSTERECTOMY  2002    OB History   No obstetric history on file.      Home Medications    Prior to Admission medications   Medication Sig Start Date End Date Taking? Authorizing Provider  amLODipine  (NORVASC ) 5 MG tablet Take 1 tablet (5 mg total) by mouth daily. 06/27/24  Yes Cleven Jansma, [place], PA-C  atorvastatin  (LIPITOR) 20 MG tablet TAKE 1 TABLET BY MOUTH EVERY DAY Patient not taking: Reported on 02/22/2024 12/10/22   Lorren Greig PARAS, NP  benzonatate (TESSALON) 200 MG capsule Take 200 mg by mouth 3 (three) times daily as needed. 02/15/24   [provider]  eletriptan (RELPAX) 40 MG tablet Take by mouth as needed.    [provider]  etanercept  (ENBREL  SURECLICK) 50 MG/ML injection Inject 50 mg into the skin once a week. 02/22/24   Rice, Lonni ORN, MD  guaiFENesin (MUCINEX PO) Take by  mouth.    [provider]  Multiple Vitamin (MULTIVITAMIN) capsule Take 1 capsule by mouth daily.    [provider]    Family History Family History  Problem Relation Age of Onset   Diabetes Mother    Hypertension Mother    Sarcoidosis Father    Prostate cancer Father    Hypertension Sister    Migraines Sister    Sickle cell trait Sister    Migraines Brother     Social History Social History   Tobacco Use   Smoking status: Never    Passive exposure: Never   Smokeless tobacco: Never  Vaping Use   Vaping status: Never Used  Substance Use Topics   Alcohol use: Not Currently   Drug use: Never     Allergies   Doxycycline and Sulfa antibiotics   Review of Systems Review of Systems Per HPI  Physical Exam Triage Vital Signs ED Triage Vitals  Encounter Vitals Group     BP 06/27/24 1109 (!) 157/98     Girls Systolic BP Percentile --      Girls Diastolic BP Percentile --      Boys Systolic BP Percentile --      Boys Diastolic BP Percentile --      Pulse Rate 06/27/24 1109 81  Resp 06/27/24 1109 18     Temp 06/27/24 1109 98.5 F (36.9 C)     Temp Source 06/27/24 1109 Oral     SpO2 06/27/24 1109 97 %     Weight 06/27/24 1108 160 lb (72.6 kg)     Height --      Head Circumference --      Peak Flow --      Pain Score 06/27/24 1108 0     Pain Loc --      Pain Education --      Exclude from Growth Chart --    No data found.  Updated Vital Signs BP (!) 145/92 (BP Location: Left Arm)   Pulse 81   Temp 98.5 F (36.9 C) (Oral)   Resp 18   Wt 160 lb (72.6 kg)   SpO2 97%   BMI 28.80 kg/m   Visual Acuity Right Eye Distance:   Left Eye Distance:   Bilateral Distance:    Right Eye Near:   Left Eye Near:    Bilateral Near:     Physical Exam Vitals and nursing note reviewed.  Constitutional:      General: She is not in acute distress.    Appearance: Normal appearance.  HENT:     Mouth/Throat:     Mouth: Mucous membranes are moist.      Pharynx: Oropharynx is clear.  Eyes:     Conjunctiva/sclera: Conjunctivae normal.     Pupils: Pupils are equal, round, and reactive to light.  Cardiovascular:     Rate and Rhythm: Normal rate and regular rhythm.     Pulses: Normal pulses.     Heart sounds: Normal heart sounds.  Pulmonary:     Effort: Pulmonary effort is normal.     Breath sounds: Normal breath sounds.  Abdominal:     Palpations: Abdomen is soft.  Neurological:     General: No focal deficit present.     Mental Status: She is alert and oriented to person, place, and time.     Sensory: No sensory deficit.     Motor: No weakness.     Coordination: Coordination normal.     Gait: Gait normal.      UC Treatments / Results  Labs (all labs ordered are listed, but only abnormal results are displayed) Labs Reviewed - No data to display  EKG   Radiology No results found.  Procedures Procedures (including critical care time)  Medications Ordered in UC Medications - No data to display  Initial Impression / Assessment and Plan / UC Course  I have reviewed the triage vital signs and the nursing notes.  Pertinent labs & imaging results that were available during my care of the patient were reviewed by me and considered in my medical decision making (see chart for details).  BP 145/92 Neurologically intact, no symptoms.  No red flags. Will restart her amlodipine  5 mg once daily.  I have also set her up with a primary care for just over a month from now, 9/12.  Can return in the meantime with any concerns  Final Clinical Impressions(s) / UC Diagnoses   Final diagnoses:  Hypertension, unspecified type  Medication refill     Discharge Instructions      Re-start amlodipine  5 mg -- one tablet daily  Please follow up with your new primary care! Visit details are attached     ED Prescriptions     Medication Sig Dispense Auth. Provider   amLODipine  (NORVASC ) 5  MG tablet Take 1 tablet (5 mg total) by  mouth daily. 45 tablet Nochum Fenter, [place], PA-C      PDMP not reviewed this encounter.   Floye Fesler, [place], [place] 06/27/24 1305

## 2024-08-05 ENCOUNTER — Ambulatory Visit: Admitting: Family Medicine

## 2024-08-05 ENCOUNTER — Encounter: Payer: Self-pay | Admitting: Family Medicine

## 2024-08-05 VITALS — BP 147/89 | HR 66 | Temp 97.4°F | Resp 18 | Ht 61.0 in | Wt 170.2 lb

## 2024-08-05 DIAGNOSIS — E782 Mixed hyperlipidemia: Secondary | ICD-10-CM

## 2024-08-05 DIAGNOSIS — I10 Essential (primary) hypertension: Secondary | ICD-10-CM | POA: Diagnosis not present

## 2024-08-05 DIAGNOSIS — M0609 Rheumatoid arthritis without rheumatoid factor, multiple sites: Secondary | ICD-10-CM | POA: Diagnosis not present

## 2024-08-05 DIAGNOSIS — Z6832 Body mass index (BMI) 32.0-32.9, adult: Secondary | ICD-10-CM

## 2024-08-05 DIAGNOSIS — E6609 Other obesity due to excess calories: Secondary | ICD-10-CM

## 2024-08-05 DIAGNOSIS — Z79899 Other long term (current) drug therapy: Secondary | ICD-10-CM

## 2024-08-05 DIAGNOSIS — E66811 Obesity, class 1: Secondary | ICD-10-CM

## 2024-08-05 DIAGNOSIS — Z7689 Persons encountering health services in other specified circumstances: Secondary | ICD-10-CM

## 2024-08-05 LAB — LIPID PANEL
Cholesterol: 202 mg/dL — ABNORMAL HIGH (ref 0–200)
HDL: 48.4 mg/dL (ref >39.00)
LDL Cholesterol: 137 mg/dL — ABNORMAL HIGH (ref 0–99)
NonHDL: 153.92
Total CHOL/HDL Ratio: 4
Triglycerides: 85 mg/dL (ref 0.0–149.0)
VLDL: 17 mg/dL (ref 0.0–40.0)

## 2024-08-05 LAB — COMPREHENSIVE METABOLIC PANEL WITH GFR
ALT: 15 U/L (ref 0–35)
AST: 17 U/L (ref 0–37)
Albumin: 4.1 g/dL (ref 3.5–5.2)
Alkaline Phosphatase: 90 U/L (ref 39–117)
BUN: 9 mg/dL (ref 6–23)
CO2: 26 meq/L (ref 19–32)
Calcium: 9.4 mg/dL (ref 8.4–10.5)
Chloride: 106 meq/L (ref 96–112)
Creatinine, Ser: 0.73 mg/dL (ref 0.40–1.20)
GFR: 91 mL/min (ref >60.00)
Glucose, Bld: 87 mg/dL (ref 70–99)
Potassium: 3.9 meq/L (ref 3.5–5.1)
Sodium: 139 meq/L (ref 135–145)
Total Bilirubin: 0.5 mg/dL (ref 0.2–1.2)
Total Protein: 8 g/dL (ref 6.0–8.3)

## 2024-08-05 LAB — CBC WITH DIFFERENTIAL/PLATELET
Basophils Absolute: 0 K/uL (ref 0.0–0.1)
Basophils Relative: 0.7 % (ref 0.0–3.0)
Eosinophils Absolute: 0.1 K/uL (ref 0.0–0.7)
Eosinophils Relative: 1.6 % (ref 0.0–5.0)
HCT: 38.8 % (ref 36.0–46.0)
Hemoglobin: 12.9 g/dL (ref 12.0–15.0)
Lymphocytes Relative: 34.4 % (ref 12.0–46.0)
Lymphs Abs: 1.7 K/uL (ref 0.7–4.0)
MCHC: 33.2 g/dL (ref 30.0–36.0)
MCV: 84.5 fl (ref 78.0–100.0)
Monocytes Absolute: 0.3 K/uL (ref 0.1–1.0)
Monocytes Relative: 6.4 % (ref 3.0–12.0)
Neutro Abs: 2.9 K/uL (ref 1.4–7.7)
Neutrophils Relative %: 56.9 % (ref 43.0–77.0)
Platelets: 292 K/uL (ref 150.0–400.0)
RBC: 4.59 Mil/uL (ref 3.87–5.11)
RDW: 13.8 % (ref 11.5–15.5)
WBC: 5 K/uL (ref 4.0–10.5)

## 2024-08-05 LAB — TSH: TSH: 0.59 u[IU]/mL (ref 0.35–5.50)

## 2024-08-05 LAB — MICROALBUMIN / CREATININE URINE RATIO
Creatinine,U: 77.4 mg/dL
Microalb Creat Ratio: 12.8 mg/g (ref 0.0–30.0)
Microalb, Ur: 1 mg/dL (ref 0.0–1.9)

## 2024-08-05 LAB — SEDIMENTATION RATE: Sed Rate: 20 mm/h (ref 0–30)

## 2024-08-05 LAB — HEMOGLOBIN A1C: Hgb A1c MFr Bld: 5.6 % (ref 4.6–6.5)

## 2024-08-05 MED ORDER — AMLODIPINE-ATORVASTATIN 5-20 MG PO TABS: 1.0000 | ORAL_TABLET | Freq: Every day | ORAL | 3 refills | Status: DC

## 2024-08-05 NOTE — Progress Notes (Signed)
 Assessment & Plan   Assessment/Plan:    Assessment & Plan Essential hypertension Hypertension is the primary concern with a current blood pressure of 143/96 mmHg. She is on amlodipine  5 mg daily. Given her rheumatoid arthritis and increased cardiovascular risk, managing blood pressure is crucial to prevent complications such as myocardial infarction and cerebrovascular accident. Adding a second medication at a lower dose can be more effective than increasing the current medication dose, which can lead to increased side effects. - Add olmesartan 20 mg to amlodipine  5 mg in a combination pill. - Order labs to assess kidney function, electrolytes, and other parameters. - Schedule a follow-up visit in one month to reassess blood pressure. - Instruct her to bring her home blood pressure cuff to the next visit for calibration.  Obesity, class 1 Obesity with a BMI of 32. She has made dietary changes, including cutting out meat except for seafood. Emphasized the importance of lifestyle modifications in managing obesity and related conditions. - Order hemoglobin A1c to screen for diabetes. - Provide encouragement for continued dietary changes and lifestyle modifications.  Mixed hyperlipidemia She is not currently on statin therapy due to a lack of communication with her previous provider. Prefers to avoid medications if possible and is interested in lifestyle modifications. Plans to address cholesterol management after obtaining lab results. - Order a fasting lipid panel to assess cholesterol levels. - Discuss cholesterol management options after lab results are available.  General Health Maintenance She is on Enbrel  for rheumatoid arthritis and requires regular monitoring. Plans to coordinate lab work with her rheumatologist to avoid multiple blood draws. - Order vitamin D level due to immunosuppressant use. - Order CBC to assess hematologic function. - Order complete metabolic panel to assess  liver and renal function. - Order urinalysis with microscopic and reflex culture to assess renal function.      Medications Discontinued During This Encounter  Medication Reason   guaiFENesin (MUCINEX PO)    benzonatate (TESSALON) 200 MG capsule    amLODipine  (NORVASC ) 5 MG tablet    atorvastatin  (LIPITOR) 20 MG tablet     Return in about 1 month (around 09/04/2024) for BP.        Subjective:   Encounter date: 08/05/2024  Keiara Sneeringer is a 58 y.o. female who has Rheumatoid arthritis of multiple sites with negative rheumatoid factor (HCC); High risk medication use; Hyperlipidemia; and Hip stiffness, right on their problem list..   She  has a past medical history of Bronchitis, Fibroids, Hyperlipidemia, Hypertension, Migraines, Rheumatoid arthritis (HCC), Sickle cell anemia (HCC) (2066-08-22), and Sickle cell trait (HCC)..   She presents with chief complaint of Establish Care (Pt is fasting today //HM due- vaccinations; mammogram and cervical screening report located in CE tab), Hypertension (Pt BP readings at home range from 134/94. Pt went to UC on 06/27/2024), Obesity (Pt would like to discuss weight management options ), and Migraine (Pt is currently taking  (RELPAX) 40 MG tablet. Pt stated Rx is effecting daily function and causing sleepiness ) .   Discussed the use of AI scribe software for clinical note transcription with the patient, who gave verbal consent to proceed.  History of Present Illness Deysi Soldo is a 58 year old female with hypertension who presents to establish care and address blood pressure management.  Hypertension - Elevated blood pressure, prompting urgent care visit last month - Currently taking amlodipine  5 mg daily - Hypertension onset coincided with rheumatoid arthritis diagnosis - Seeking to address blood pressure  management  Rheumatoid arthritis - Ongoing joint aches despite current therapy - Recent development of a nodule -  Follows with rheumatology and undergoes blood work every six months - Currently on Enbrel   Obesity and hyperlipidemia - History of obesity and hyperlipidemia - Not currently on a statin due to lack of communication with previous primary care provider - Prefers to avoid medications when possible - Interested in lifestyle modifications - Has eliminated meat other than seafood from diet  Migraine headaches - History of migraines - Current medication causes significant drowsiness - Considering alternative options for migraine management  Fatigue and low energy - Fatigue and low energy levels attributed to menopause and rheumatoid arthritis - No fever or other acute symptoms     ROS  Past Surgical History:  Procedure Laterality Date   ABDOMINAL HYSTERECTOMY  11/2020   Partial   BUNIONECTOMY Right    PARTIAL HYSTERECTOMY  2002    Outpatient Medications Prior to Visit  Medication Sig Dispense Refill   eletriptan (RELPAX) 40 MG tablet Take by mouth as needed.     estradiol (VIVELLE-DOT) 0.0375 MG/24HR SMARTSIG:Patch T-DERMAL     etanercept  (ENBREL  SURECLICK) 50 MG/ML injection Inject 50 mg into the skin once a week. 4 mL 5   Multiple Vitamin (MULTIVITAMIN) capsule Take 1 capsule by mouth daily.     amLODipine  (NORVASC ) 5 MG tablet Take 1 tablet (5 mg total) by mouth daily. 45 tablet 0   atorvastatin  (LIPITOR) 20 MG tablet TAKE 1 TABLET BY MOUTH EVERY DAY (Patient not taking: Reported on 02/22/2024) 90 tablet 0   benzonatate (TESSALON) 200 MG capsule Take 200 mg by mouth 3 (three) times daily as needed.     guaiFENesin (MUCINEX PO) Take by mouth.     No facility-administered medications prior to visit.    Family History  Problem Relation Age of Onset   Diabetes Mother    Hypertension Mother    Sarcoidosis Father    Prostate cancer Father    Hypertension Sister    Migraines Sister    Sickle cell trait Sister    Migraines Brother    Hypertension Brother    Miscarriages /  [place] Daughter     Social History   Socioeconomic History   Marital status: Single    Spouse name: Not on file   Number of children: Not on file   Years of education: Not on file   Highest education level: Bachelor's degree (e.g., BA, AB, BS)  Occupational History   Not on file  Tobacco Use   Smoking status: Never    Passive exposure: Never   Smokeless tobacco: Never  Vaping Use   Vaping status: Never Used  Substance and Sexual Activity   Alcohol use: Not Currently   Drug use: Never   Sexual activity: Not Currently    Birth control/protection: Surgical  Other Topics Concern   Not on file  Social History Narrative   Not on file   Social Drivers of Health   Financial Resource Strain: Low Risk  (08/04/2024)   Overall Financial Resource Strain (CARDIA)    Difficulty of Paying Living Expenses: Not hard at all  Food Insecurity: No Food Insecurity (08/04/2024)   Hunger Vital Sign    Worried About Running Out of Food in the Last Year: Never true    Ran Out of Food in the Last Year: Never true  Transportation Needs: No Transportation Needs (08/04/2024)   PRAPARE - Administrator, Civil Service (Medical): No  Lack of Transportation (Non-Medical): No  Physical Activity: Inactive (08/04/2024)   Exercise Vital Sign    Days of Exercise per Week: 0 days    Minutes of Exercise per Session: Not on file  Stress: No Stress Concern Present (08/04/2024)   [employer] of Occupational Health - Occupational Stress Questionnaire    Feeling of Stress: Only a little  Social Connections: Moderately Integrated (08/04/2024)   Social Connection and Isolation Panel    Frequency of Communication with Friends and Family: Three times a week    Frequency of Social Gatherings with Friends and Family: Once a week    Attends Religious Services: More than 4 times per year    Active Member of [employer] or Organizations: Yes    Attends Banker Meetings: 1 to 4 times per year     Marital Status: Divorced  Intimate Partner Violence: Unknown (09/16/2022)   Received from [facility]   HITS    Physically Hurt: Not on file    Insult or Talk Down To: Not on file    Threaten Physical Harm: Not on file    Scream or Curse: Not on file                                                                                                  Objective:  Physical Exam: BP (!) 147/89 (BP Location: Right Arm, Patient Position: Sitting, Cuff Size: Large) Comment: recheck  Pulse 66   Temp (!) 97.4 F (36.3 C) (Temporal)   Resp 18   Ht 5' 1 (1.549 m)   Wt 170 lb 3.2 oz (77.2 kg)   SpO2 99%   BMI 32.16 kg/m    Physical Exam VITALS: BP- 143/96 MEASUREMENTS: BMI- 32.0. GENERAL: Alert, cooperative, well developed, no acute distress. HEENT: Normocephalic, normal oropharynx, moist mucous membranes. CHEST: Clear to auscultation bilaterally, no wheezes, rhonchi, or crackles. CARDIOVASCULAR: Normal heart rate and rhythm, S1 and S2 normal without murmurs. ABDOMEN: Soft, non-tender, non-distended, without organomegaly, normal bowel sounds. EXTREMITIES: No cyanosis or edema. NEUROLOGICAL: Cranial nerves grossly intact, moves all extremities without gross motor or sensory deficit.   Physical Exam  No results found.  No results found for this or any previous visit (from the past 2160 hours).      Beverley Adine Hummer, MD, MS

## 2024-08-05 NOTE — Addendum Note (Signed)
 Addended by: SEBASTIAN RIGHTER B on: 08/05/2024 09:15 AM   Modules accepted: Orders

## 2024-08-08 ENCOUNTER — Telehealth: Payer: Self-pay

## 2024-08-08 NOTE — Progress Notes (Signed)
 Office Visit Note  Patient: Kimberly Roberts             Date of Birth: 28-Feb-1966           MRN: [id number]             PCP: Sebastian Beverley NOVAK, MD Referring: Lorren Greig PARAS, NP Visit Date: 08/22/2024   Subjective:  Pain (Right hand w/ a nodule )   Discussed the use of AI scribe software for clinical note transcription with the patient, who gave verbal consent to proceed.  History of Present Illness   Kimberly Roberts is a 58 y.o. female here for follow up for seronegative rheumatoid arthritis on Enbrel  50 mg subcu weekly. She presents with recent development of a painful nodule in her right hand.  She has a painful nodule in her right hand, specifically affecting her fourth finger. Also had wrist pain on the same side initially. The nodule was first noticed at the beginning of the month and has persisted for about three weeks. It was tender and causes discomfort when pressure is applied, such as when driving. No issues with getting stuck or inability to bend or flex the finger, but there is persistent pain.  She has been using over-the-counter topical treatments, specifically 'flex joint', to manage the pain and prefers not to take additional medications if possible. Blood work conducted at her primary care office, including a sedimentation rate test, was normal. Her vitamin D  levels were slightly high, possibly due to supplementation.  She reports no recent illnesses or unusual activities that could have exacerbated her symptoms. No recent fever or need for antibiotics.       Previous HPI 02/22/2024 Kimberly Roberts is a 58 y.o. female here for follow up for seronegative rheumatoid arthritis on Enbrel  50 mg subcu weekly.   She has not experienced any major changes or flare-ups in her arthritis symptoms since her last visit in September. No morning stiffness beyond her normal levels and no issues with her current Enbrel  treatment. She mentions that the copay for Enbrel   has increased, which has been a concern.   She developed a rash on her arms, described as itchy with small spots, lasting about two weeks within the last six months. A cream was prescribed by another doctor, which did not provide lasting relief.   She has a cough, diagnosed as viral bronchitis last Monday. The cough is improving but is taking longer to resolve than expected. No other upper respiratory infections this winter.     Previous HPI 08/24/2023 Kimberly Roberts is a 58 y.o. female here for follow up for seronegative rheumatoid arthritis on Enbrel  50 mg subcu weekly.  Overall she has been doing well has daily morning stiffness lasting less than 15 minutes typically takes a hot shower and is doing better by the end of it.  No major flareups or exacerbation does not have to take any daily as needed medication for pain.  She currently has URI symptoms with rhinitis with some cough and drainage ongoing since last week.   Previous HPI 02/17/2023 Kimberly Roberts is a 58 y.o. female here for follow up for seronegative rheumatoid arthritis on Enbrel  50 mg subcu weekly.  Overall she is doing well since our last visit without major interval events.  No serious infections.  She has morning stiffness lasting for a few minutes every day usually is resolved by the time she takes a morning shower.  Not requiring any NSAIDs  or other over-the-counter pain relievers.  She sees a little bit of swelling in finger joints but not on a daily basis.   Previous HPI 08/11/22 [name] is a 58 y.o. female here for follow up for seronegative RA on Enbrel  50 mg weekly. She is doing well still has a few minutes of morning stiffness daily. No major flare up and no problems taking the enbrel . No serious infections.   Previous HPI 01/31/2022 Kimberly Roberts is a 58 y.o. female here for follow up for seronegative RA on Enbrel  50 mg [place] weekly. She feels symptoms are well controlled joint stiffness in  the mornings improves usually within about 15 minutes or less or with taking a hot shower. She still has intermittent fatigue not excessively sleepy or falling asleep during the day. She recalls this symptom since years ago it was slightly less when on both enbrel  and methotrexate but she thinks it is less of a problem than the methotrexate side effects. No interval infections or major medical changes.   Previous HPI 07/17/21 Kimberly Roberts is a 58 y.o. female here for rheumatoid arthritis.  She was originally diagnosed in [place] in 2016 after development of bilateral palmar erythematous rashes with joint pain and swelling in her wrists and proximal finger joints. This was initially treated with steroids and then went on subcutaneous methotrexate she took this for about 6 months as monotherapy with a fair amount of improvement though tolerated the medicine poorly with persistent fatigue nausea and feeling sick continuously.  Addition of Enbrel  resulted in complete symptom improvement and she subsequently came off the methotrexate did extremely well on Enbrel  monotherapy for the past 2 years.  She moved from [place] to this area around December for a new job position and to be closer with family and needs to reestablish rheumatology care.  She has run out of the medicine and is off Enbrel  since about 2 months ago.  She is starting to notice increasing symptoms although still mild in the left wrist and in the right foot but without obvious swelling, and also noticing a significant increase in generalized fatigue.   DMARD Hx Enbrel  2017- current Methotrexate 2016-2019   Labs reviewed 2016 ANA 1:640 Nuclear dots RNP, SSA, SSB, Smith, dsDNA, centromere, Scl-7, ACA neg RF neg CCP neg HLA-B27 neg G6PD 8.6 (9.9-16.6) Uric acid 3.5   Imaging reviewed (reports) 2016 CXR no active airspace disease 2016 Bilateral hand xrays without erosive disease     Review of Systems  Constitutional:   Positive for fatigue.  HENT:  Negative for mouth sores and mouth dryness.   Eyes:  Negative for dryness.  Respiratory:  Negative for shortness of breath.   Cardiovascular:  Negative for chest pain and palpitations.  Gastrointestinal:  Negative for blood in stool, constipation and diarrhea.  Endocrine: Negative for increased urination.  Genitourinary:  Negative for involuntary urination.  Musculoskeletal:  Positive for joint pain, joint pain, joint swelling, myalgias, morning stiffness and myalgias. Negative for gait problem, muscle weakness and muscle tenderness.  Skin:  Negative for color change, rash, hair loss and sensitivity to sunlight.  Allergic/Immunologic: Negative for susceptible to infections.  Neurological:  Negative for dizziness and headaches.  Hematological:  Negative for swollen glands.  Psychiatric/Behavioral:  Negative for depressed mood and sleep disturbance. The patient is not nervous/anxious.     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   Trait   Sickle cell trait     Family History  Problem Relation Age of Onset   Diabetes Mother    Hypertension Mother    Sarcoidosis Father    Prostate cancer Father    Hypertension Sister    Migraines Sister    Sickle cell trait Sister    Migraines Brother    Hypertension Brother    Miscarriages / Stillbirths Daughter    Past Surgical History:  Procedure Laterality Date   ABDOMINAL HYSTERECTOMY  11/2020   Partial   BUNIONECTOMY Right    PARTIAL HYSTERECTOMY  2002   Social History    Social History Narrative   Not on file   Immunization History  Administered Date(s) Administered   Tdap 08/23/2018   Zoster, Live 08/05/2017, 12/23/2017     Objective: Vital Signs: BP 112/71   Pulse 71   Temp 97.6 F (36.4 C)   Resp 16   Ht 5' 1 (1.549 m)   Wt 170 lb (77.1 kg)   BMI 32.12 kg/m    Physical Exam Eyes:     Conjunctiva/sclera: Conjunctivae normal.  Cardiovascular:     Rate and Rhythm: Normal rate and regular rhythm.  Pulmonary:     Effort: Pulmonary effort is normal.     Breath sounds: Normal breath sounds.  Musculoskeletal:     Right lower leg: No edema.     Left lower leg: No edema.  Lymphadenopathy:     Cervical: No cervical adenopathy.  Skin:    General: Skin is warm and dry.  Neurological:     Mental Status: She is alert.  Psychiatric:        Mood and Affect: Mood normal.      Musculoskeletal Exam:  Shoulders full ROM no tenderness or swelling Elbows full ROM no tenderness or swelling Wrists full ROM no tenderness or swelling Fingers full ROM no tenderness or swelling Knees full ROM no tenderness or swelling, b/l patellofemoral crepitus   Investigation: No additional findings.  Imaging: No results found.  Recent Labs: Lab Results  Component Value Date   WBC 5.0 08/05/2024   HGB 12.9 08/05/2024   PLT 292.0 08/05/2024   NA 139 08/05/2024   K 3.9 08/05/2024   CL 106 08/05/2024   CO2 26 08/05/2024   GLUCOSE 87 08/05/2024   BUN 9 08/05/2024   CREATININE 0.73 08/05/2024   BILITOT 0.5 08/05/2024   ALKPHOS 90 08/05/2024   AST 17 08/05/2024   ALT 15 08/05/2024   PROT 8.0 08/05/2024   ALBUMIN 4.1 08/05/2024   CALCIUM  9.4 08/05/2024   QFTBGOLDPLUS NEGATIVE 08/24/2023    Speciality Comments: No specialty comments available.  Procedures:  No procedures performed Allergies: Doxycycline and Sulfa antibiotics   Assessment / Plan:     Visit Diagnoses: Rheumatoid arthritis of multiple sites with negative rheumatoid factor  (HCC) - Plan: etanercept  (ENBREL  SURECLICK) 50 MG/ML injection Recent flare-up in right wrist and fourth finger with tender nodule and swelling. Partial improvement noted. Isolated symptoms and normal inflammatory markers are reassuring. Symptoms improved by now without specific intervention. Maybe use related, okay to monitor  without change in maintenance. Reviewed recent sed rate normal. - Continue Enbrel  50 mg [place] weekly. - Avoid live vaccines while on Enbrel - discussed normal seasonal series for flu, RSV, or COVID are okay and do not require medication interruption for her monotherapy  High risk medication use - Enbrel  50 mg subcu weekly - Plan: etanercept  (ENBREL  SURECLICK) 50 MG/ML injection Tolerating medication without any drug reactions. No serious interval infections. Discussed vaccines as above. Recent labs reviewed form PCP office with normal CBC and CMP. Assistance for this is appreciated.  Vitamin D  deficiency Reviewed recent labs her vitamin D  1,25 D2 level was just slightly high. Typically I just monitor the total storage level vitamin D  25-OH so I am not sure about the significance with 6 point elevation in a single form. She reports taking a daily multivitamin and a daily OTC supplement with 1000 IUs. I recommended she check her multivitamin to see if this contains vitamin D . If so, she can stop her other supplement.  Orders: No orders of the defined types were placed in this encounter.  Meds ordered this encounter  Medications   etanercept  (ENBREL  SURECLICK) 50 MG/ML injection    Sig: Inject 50 mg into the skin once a week.    Dispense:  12 mL    Refill:  1    Prescription Type::   Renewal     Follow-Up Instructions: Return in about 6 months (around 02/19/2025) for RA on ENB f/u 6mos.   Lonni LELON Ester, MD  Note - This record has been created using [employer].  Chart creation errors have been sought, but may not always  have been located. Such creation  errors do not reflect on  the standard of medical care.

## 2024-08-08 NOTE — Telephone Encounter (Signed)
 Copied from CRM [id number]. Topic: Clinical - Prescription Issue >> Aug 08, 2024 12:30 PM Mesmerise C wrote: Reason for CRM: [facility] amLODipine -atorvastatin  (CADUET ) 5-20 MG tablet and no one had it in a 25 mile radius but should have it today and wanting to fill it for a 90 day supply but needing approval from provider

## 2024-08-08 NOTE — Telephone Encounter (Signed)
 Called patient pharmacy and RX was refilled and will be ready for pick up shortly. I also called and left patient a VM to inform.

## 2024-08-09 ENCOUNTER — Other Ambulatory Visit: Payer: Self-pay | Admitting: Internal Medicine

## 2024-08-09 DIAGNOSIS — M0609 Rheumatoid arthritis without rheumatoid factor, multiple sites: Secondary | ICD-10-CM

## 2024-08-09 DIAGNOSIS — Z79899 Other long term (current) drug therapy: Secondary | ICD-10-CM

## 2024-08-09 NOTE — Telephone Encounter (Signed)
 Copied from CRM [id number]. Topic: Clinical - Prescription Issue >> Aug 09, 2024 10:04 AM Revonda D wrote: Reason for CRM: : [facility] stated [facility] amLOD[facility]ine -atorvastatin  (CADUET ) 5-20 MG tablet and no one had it in a 25 mile radius but should have it today and wanting to fill it for a 90 day supply but needing approval from provider.  Update: Pt stated that [facility] today and they are still out of the medication. Pt stated that she would like to get the medication sent to the [id number] - [place], [place] - [address], [address] [place] [id number]. Pt stated that she is going on day 4 without this medication and would like to have the medication sent to the pharmacy today if possible.

## 2024-08-09 NOTE — Telephone Encounter (Signed)
 Called patient and left brief VM. I also contacted patient pharmacy regarding RX and they are currently behind due to staffing; RX should be refilled and ready by the end of the week.

## 2024-08-09 NOTE — Telephone Encounter (Signed)
 Last Fill: 02/22/2024  Labs: 08/05/2024 CBC/CMP WNL  TB Gold: 08/24/2023 Neg   Next Visit: 08/22/2024  Last Visit: 02/22/2024  IK:Myzlfjunpi arthritis of multiple sites with negative rheumatoid factor   Current Dose per office note 02/22/2024: Enbrel  50 mg [place] weekly   Patient to update TB Gold at upcoming appointment on 08/18/2024  Okay to refill Enbrel ?

## 2024-08-10 ENCOUNTER — Ambulatory Visit: Payer: Self-pay | Admitting: Family Medicine

## 2024-08-10 DIAGNOSIS — E782 Mixed hyperlipidemia: Secondary | ICD-10-CM

## 2024-08-10 DIAGNOSIS — E673 Hypervitaminosis D: Secondary | ICD-10-CM

## 2024-08-10 LAB — URINALYSIS W MICROSCOPIC + REFLEX CULTURE
Bilirubin Urine: NEGATIVE
Glucose, UA: NEGATIVE
Hgb urine dipstick: NEGATIVE
Hyaline Cast: NONE SEEN /LPF
Ketones, ur: NEGATIVE
Leukocyte Esterase: NEGATIVE
Nitrites, Initial: NEGATIVE
Protein, ur: NEGATIVE
Specific Gravity, Urine: 1.011 (ref 1.001–1.035)
pH: 6.5 (ref 5.0–8.0)

## 2024-08-10 LAB — VITAMIN D 1,25 DIHYDROXY
Vitamin D 1, 25 (OH)2 Total: 78 pg/mL — ABNORMAL HIGH (ref 18–72)
Vitamin D2 1, 25 (OH)2: <8 pg/mL
Vitamin D3 1, 25 (OH)2: 78 pg/mL

## 2024-08-10 LAB — NO CULTURE INDICATED

## 2024-08-12 ENCOUNTER — Telehealth: Payer: Self-pay

## 2024-08-12 MED ORDER — AMLODIPINE-OLMESARTAN 10-20 MG PO TABS: 1.0000 | ORAL_TABLET | Freq: Every day | ORAL | 0 refills | Status: DC

## 2024-08-12 MED ORDER — ROSUVASTATIN CALCIUM 10 MG PO TABS: 10.0000 mg | ORAL_TABLET | Freq: Every day | ORAL | 3 refills | Status: AC

## 2024-08-12 NOTE — Telephone Encounter (Signed)
 Called patient and inform that she should take amlodipine -olmersartan 10-20MG  that was prescribed and advised on last OV; left VM with brief message and call back number if she have any question or concerns.

## 2024-08-12 NOTE — Telephone Encounter (Signed)
 Copied from CRM [id number]. Topic: Clinical - Medication Question >> Aug 12, 2024 12:01 PM Macario HERO wrote: Reason for CRM: Patient called said that she picked up her blood pressure medication amlodipine  on 9/16. Now [facility] that amlodipine -olmesartan  (AZOR ) 10-20 MG tablet [[id number]] is ready fir pick up.Patient said she is confused on which medication to take.

## 2024-08-18 ENCOUNTER — Ambulatory Visit: Admitting: Family Medicine

## 2024-08-22 ENCOUNTER — Encounter: Payer: Self-pay | Admitting: Internal Medicine

## 2024-08-22 ENCOUNTER — Ambulatory Visit: Attending: Internal Medicine | Admitting: Internal Medicine

## 2024-08-22 VITALS — BP 112/71 | HR 71 | Temp 97.6°F | Resp 16 | Ht 61.0 in | Wt 170.0 lb

## 2024-08-22 DIAGNOSIS — Z79899 Other long term (current) drug therapy: Secondary | ICD-10-CM

## 2024-08-22 DIAGNOSIS — E559 Vitamin D deficiency, unspecified: Secondary | ICD-10-CM | POA: Insufficient documentation

## 2024-08-22 DIAGNOSIS — M0609 Rheumatoid arthritis without rheumatoid factor, multiple sites: Secondary | ICD-10-CM

## 2024-08-22 DIAGNOSIS — M25651 Stiffness of right hip, not elsewhere classified: Secondary | ICD-10-CM

## 2024-08-22 MED ORDER — ENBREL SURECLICK 50 MG/ML ~~LOC~~ SOAJ: 50.0000 mg | SUBCUTANEOUS | 1 refills | Status: AC

## 2024-08-23 ENCOUNTER — Ambulatory Visit: Payer: 59 | Admitting: Internal Medicine

## 2024-08-30 ENCOUNTER — Encounter: Payer: Self-pay | Admitting: Family Medicine

## 2024-08-30 ENCOUNTER — Telehealth: Payer: Self-pay

## 2024-08-30 ENCOUNTER — Ambulatory Visit: Admitting: Family Medicine

## 2024-08-30 ENCOUNTER — Other Ambulatory Visit: Payer: Self-pay | Admitting: Family Medicine

## 2024-08-30 VITALS — BP 112/81 | HR 73 | Temp 96.5°F | Ht 61.0 in | Wt 169.2 lb

## 2024-08-30 DIAGNOSIS — Z6832 Body mass index (BMI) 32.0-32.9, adult: Secondary | ICD-10-CM

## 2024-08-30 DIAGNOSIS — Z23 Encounter for immunization: Secondary | ICD-10-CM

## 2024-08-30 DIAGNOSIS — G43109 Migraine with aura, not intractable, without status migrainosus: Secondary | ICD-10-CM

## 2024-08-30 DIAGNOSIS — E66811 Obesity, class 1: Secondary | ICD-10-CM

## 2024-08-30 DIAGNOSIS — E6609 Other obesity due to excess calories: Secondary | ICD-10-CM

## 2024-08-30 DIAGNOSIS — E673 Hypervitaminosis D: Secondary | ICD-10-CM | POA: Diagnosis not present

## 2024-08-30 DIAGNOSIS — I10 Essential (primary) hypertension: Secondary | ICD-10-CM

## 2024-08-30 DIAGNOSIS — E782 Mixed hyperlipidemia: Secondary | ICD-10-CM

## 2024-08-30 LAB — LIPID PANEL
Cholesterol: 140 mg/dL (ref 0–200)
HDL: 45.4 mg/dL (ref >39.00)
LDL Cholesterol: 72 mg/dL (ref 0–99)
NonHDL: 94.54
Total CHOL/HDL Ratio: 3
Triglycerides: 115 mg/dL (ref 0.0–149.0)
VLDL: 23 mg/dL (ref 0.0–40.0)

## 2024-08-30 LAB — BASIC METABOLIC PANEL WITH GFR
BUN: 9 mg/dL (ref 6–23)
CO2: 26 meq/L (ref 19–32)
Calcium: 9.4 mg/dL (ref 8.4–10.5)
Chloride: 106 meq/L (ref 96–112)
Creatinine, Ser: 0.71 mg/dL (ref 0.40–1.20)
GFR: 94.04 mL/min (ref >60.00)
Glucose, Bld: 82 mg/dL (ref 70–99)
Potassium: 3.9 meq/L (ref 3.5–5.1)
Sodium: 140 meq/L (ref 135–145)

## 2024-08-30 MED ORDER — NURTEC 75 MG PO TBDP: 1.0000 | ORAL_TABLET | ORAL | 1 refills | Status: AC

## 2024-08-30 NOTE — Telephone Encounter (Signed)
 Can we start/submit prior authorization for (NURTEC ODT 75 MG TABLET)

## 2024-08-30 NOTE — Progress Notes (Signed)
 Assessment & Plan   Assessment/Plan:    Assessment & Plan Migraine with aura Migraine with aura occurring approximately once a month, lasting about a day. Current abortive treatment with eletriptan is effective but causes excessive drowsiness. She has tried Imitrex and Topamax without success.  Current prophylaxis from amlodipine  and olmesartan , but still has occasional breakthrough headache.  Nurtec was discussed as a potential alternative for abortive/prophylaxis with fewer side effects. - Prescribe Nurtec 75 mg at onset of migraine for abortive treatment. - May tryNurtec 75 mg every other day for prevention, pending insurance approval.  Hypertension Hypertension is well-controlled with current medication regimen. Blood pressure reading today is 112/81 mmHg. Amlodipine  and olmesartan  are causing daytime drowsiness. Explained that amlodipine  works by relaxing blood vessels, while olmesartan  affects blood pressure at the kidney level. Discussed shifting medication dosing to the evening to reduce drowsiness. - Shift amlodipine  and olmesartan  dosing to the evening to reduce daytime drowsiness. - Recheck renal function with BMP due to olmesartan  use. - Follow up in three months for blood pressure check.  Hyperlipidemia Hyperlipidemia with elevated cholesterol noted one month ago. Rosuvastatin  10 mg was initiated to manage cholesterol levels and reduce cardiovascular risk. - Recheck fasting lipid panel to assess the effect of rosuvastatin  on cholesterol levels. - Monitor liver function due to potential statin side effects.  Obesity class I BMI 31 with comorbid hypertension hyperlipidemia vitamin D  deficiency Weight gain of 10 pounds over six months. She reports a healthy diet and reduced meat intake. Menopause and rheumatoid arthritis may contribute to weight changes. Discussed lifestyle modifications and potential referral to a nutritionist. - Refer to a nutritionist for dietary counseling and  weight management.  Vitamin D  deficiency with hypervitaminosis  Mild elevation in Vitamin D  levels, likely due to over supplementation. She has been advised to discontinue Vitamin D  supplement while continuing multivitamin. - Recheck Vitamin D  levels.  General Health Maintenance Discussion of vaccinations and screenings. She has completed Shingrix series and had a colonoscopy in the past. No live vaccines due to rheumatoid arthritis treatment. Discussed potential vaccinations including flu, hepatitis B, and pneumonia. - Administer flu shot today. - Consider hepatitis B and pneumonia vaccinations.      There are no discontinued medications.   Return in about 3 months (around 11/30/2024) for BP.        Subjective:   Encounter date: 08/30/2024  Kimberly Roberts is a 58 y.o. female who has Rheumatoid arthritis of multiple sites with negative rheumatoid factor (HCC); High risk medication use; Hyperlipidemia; Hip stiffness, right; Vitamin D  deficiency; Class 1 obesity due to excess calories with serious comorbidity (HTN and HLD) and body mass index (BMI) of 32.0 to 32.9 in adult; Migraine with aura and without status migrainosus, not intractable; Primary hypertension; and Hypervitaminosis D on their problem list..   She  has a past medical history of Bronchitis, Fibroids, Hyperlipidemia, Hypertension, Migraines, Rheumatoid arthritis (HCC), Sickle cell anemia (HCC) (03/06/2066), and Sickle cell trait..   She presents with chief complaint of Hypertension (Pt presents today for a BP check. Pt states she left her monitor and has been checking her bp at home, states the top number has been at 140ish and the bottom number is coming out of the 90s. Pt states she was able to get the olmestartan and amlodopine. States bothe meds make her sleepy. Pt would like to discuss eletriptan (RELPAX) 40 MG tablet states it makes her completely sleepy for 4 hours. Pt plans to get mammongram done at the  mobile  [facility]. Pt had a colonoscopy done prev but not in the last50yr) .   Discussed the use of AI scribe software for clinical note transcription with the patient, who gave verbal consent to proceed.  History of Present Illness Kimberly Roberts is a 58 year old female with hypertension who presents to establish care and address blood pressure management.  Hypertension - Elevated blood pressure, prompting urgent care visit last month - Currently taking amlodipine  5 mg daily - Hypertension onset coincided with rheumatoid arthritis diagnosis - Seeking to address blood pressure management  Rheumatoid arthritis - Ongoing joint aches despite current therapy - Recent development of a nodule - Follows with rheumatology and undergoes blood work every six months - Currently on Enbrel   Obesity and hyperlipidemia - History of obesity and hyperlipidemia - Not currently on a statin due to lack of communication with previous primary care provider - Prefers to avoid medications when possible - Interested in lifestyle modifications - Has eliminated meat other than seafood from diet  Migraine headaches - History of migraines - Current medication causes significant drowsiness - Considering alternative options for migraine management  Fatigue and low energy - Fatigue and low energy levels attributed to menopause and rheumatoid arthritis - No fever or other acute symptoms   Kimberly Roberts is a 58 year old female with hypertension who presents for follow-up on blood pressure management.  Hypertension - Blood pressure at visit: 112/81 mmHg - Takes amlodipine , initially 5 mg, with possible recent dose increase - Experiences daytime drowsiness attributed to amlodipine  - Also takes olmesartan  for blood pressure control  Hyperlipidemia - Elevated cholesterol approximately one month ago - Recommended to start rosuvastatin  10 mg for cholesterol management  Rheumatoid arthritis and vitamin d   supplementation - Takes vitamin D  supplements for rheumatoid arthritis - Slightly elevated vitamin D  levels attributed to concurrent use of vitamin D  supplement and multivitamin - Discontinued vitamin D  supplement  Migraine headaches - Migraines occur approximately once per month - Headaches last one day if not treated early - Identifies caffeine and fatigue as triggers - Eletriptan provides relief but causes excessive drowsiness - Topamax was ineffective for migraine prevention - Manages migraines by avoiding known triggers  Weight gain and menopausal symptoms - Gained 10 pounds over the past six months despite efforts to maintain a healthy diet and lifestyle - Attributes some weight gain to menopause - No chest pain or shortness of breath     ROS  Past Surgical History:  Procedure Laterality Date   ABDOMINAL HYSTERECTOMY  11/2020   Partial   BUNIONECTOMY Right    PARTIAL HYSTERECTOMY  2002    Outpatient Medications Prior to Visit  Medication Sig Dispense Refill   amlodipine -olmesartan  (AZOR ) 10-20 MG tablet Take 1 tablet by mouth daily. 30 tablet 0   eletriptan (RELPAX) 40 MG tablet Take by mouth as needed.     estradiol (VIVELLE-DOT) 0.0375 MG/24HR SMARTSIG:Patch T-DERMAL     etanercept  (ENBREL  SURECLICK) 50 MG/ML injection Inject 50 mg into the skin once a week. 12 mL 1   Multiple Vitamin (MULTIVITAMIN) capsule Take 1 capsule by mouth daily.     rosuvastatin  (CRESTOR ) 10 MG tablet Take 1 tablet (10 mg total) by mouth daily. 90 tablet 3   No facility-administered medications prior to visit.    Family History  Problem Relation Age of Onset   Diabetes Mother    Hypertension Mother    Sarcoidosis Father    Prostate cancer Father    Hypertension  Sister    Migraines Sister    Sickle cell trait Sister    Migraines Brother    Hypertension Brother    Miscarriages / [place] Daughter     Social History   Socioeconomic History   Marital status: Single     Spouse name: Not on file   Number of children: Not on file   Years of education: Not on file   Highest education level: Bachelor's degree (e.g., BA, AB, BS)  Occupational History   Not on file  Tobacco Use   Smoking status: Never    Passive exposure: Never   Smokeless tobacco: Never  Vaping Use   Vaping status: Never Used  Substance and Sexual Activity   Alcohol use: Not Currently   Drug use: Never   Sexual activity: Not Currently    Birth control/protection: Surgical  Other Topics Concern   Not on file  Social History Narrative   Not on file   Social Drivers of Health   Financial Resource Strain: Low Risk  (08/04/2024)   Overall Financial Resource Strain (CARDIA)    Difficulty of Paying Living Expenses: Not hard at all  Food Insecurity: No Food Insecurity (08/04/2024)   Hunger Vital Sign    Worried About Running Out of Food in the Last Year: Never true    Ran Out of Food in the Last Year: Never true  Transportation Needs: No Transportation Needs (08/04/2024)   PRAPARE - Administrator, Civil Service (Medical): No    Lack of Transportation (Non-Medical): No  Physical Activity: Inactive (08/04/2024)   Exercise Vital Sign    Days of Exercise per Week: 0 days    Minutes of Exercise per Session: Not on file  Stress: No Stress Concern Present (08/04/2024)   [employer] of Occupational Health - Occupational Stress Questionnaire    Feeling of Stress: Only a little  Social Connections: Moderately Integrated (08/04/2024)   Social Connection and Isolation Panel    Frequency of Communication with Friends and Family: Three times a week    Frequency of Social Gatherings with Friends and Family: Once a week    Attends Religious Services: More than 4 times per year    Active Member of [employer] or Organizations: Yes    Attends Banker Meetings: 1 to 4 times per year    Marital Status: Divorced  Intimate Partner Violence: Unknown (09/16/2022)   Received from  [facility]   HITS    Physically Hurt: Not on file    Insult or Talk Down To: Not on file    Threaten Physical Harm: Not on file    Scream or Curse: Not on file                                                                                                  Objective:  Physical Exam: BP 112/81 (BP Location: Left Arm, Patient Position: Sitting)   Pulse 73   Temp (!) 96.5 F (35.8 C) (Temporal)   Ht 5' 1 (1.549 m)   Wt 169 lb 3.2 oz (76.7 kg)  SpO2 99%   BMI 31.97 kg/m    Physical Exam VITALS: BP- 143/96 MEASUREMENTS: BMI- 32.0. GENERAL: Alert, cooperative, well developed, no acute distress. HEENT: Normocephalic, normal oropharynx, moist mucous membranes. CHEST: Clear to auscultation bilaterally, no wheezes, rhonchi, or crackles. CARDIOVASCULAR: Normal heart rate and rhythm, S1 and S2 normal without murmurs. ABDOMEN: Soft, non-tender, non-distended, without organomegaly, normal bowel sounds. EXTREMITIES: No cyanosis or edema. NEUROLOGICAL: Cranial nerves grossly intact, moves all extremities without gross motor or sensory deficit.   VITALS: BP- 112/81 GENERAL: Alert, cooperative, well developed, no acute distress HEENT: Normocephalic, normal oropharynx, moist mucous membranes CHEST: Clear to auscultation bilaterally, no wheezes, rhonchi, or crackles CARDIOVASCULAR: Normal heart rate and rhythm, S1 and S2 normal without murmurs ABDOMEN: Soft, non-tender, non-distended, without organomegaly, normal bowel sounds EXTREMITIES: No cyanosis or edema NEUROLOGICAL: Cranial nerves grossly intact, moves all extremities without gross motor or sensory deficit   Physical Exam  No results found.  Recent Results (from the past 2160 hours)  TSH     Status: None   Collection Time: 08/05/24  8:48 AM  Result Value Ref Range   TSH 0.59 0.35 - 5.50 uIU/mL  Lipid panel     Status: Abnormal   Collection Time: 08/05/24  8:48 AM  Result Value Ref Range   Cholesterol 202 (H) 0 - 200  mg/dL    Comment: ATP III Classification       Desirable:  < 200 mg/dL               Borderline High:  200 - 239 mg/dL          High:  > = 759 mg/dL   Triglycerides 14.9 0.0 - 149.0 mg/dL    Comment: Normal:  <849 mg/dLBorderline High:  150 - 199 mg/dL   HDL 51.59 >60.99 mg/dL   VLDL 82.9 0.0 - 59.9 mg/dL   LDL Cholesterol 862 (H) 0 - 99 mg/dL   Total CHOL/HDL Ratio 4     Comment:                Men          Women1/2 Average Risk     3.4          3.3Average Risk          5.0          4.42X Average Risk          9.6          7.13X Average Risk          15.0          11.0                       NonHDL 153.92     Comment: NOTE:  Non-HDL goal should be 30 mg/dL higher than patient's LDL goal (i.e. LDL goal of < 70 mg/dL, would have non-HDL goal of < 100 mg/dL)  Hemoglobin J8r     Status: None   Collection Time: 08/05/24  8:48 AM  Result Value Ref Range   Hgb A1c MFr Bld 5.6 4.6 - 6.5 %    Comment: Glycemic Control Guidelines for People with Diabetes:Non Diabetic:  <6%Goal of Therapy: <7%Additional Action Suggested:  >8%   Microalbumin / creatinine urine ratio     Status: None   Collection Time: 08/05/24  8:48 AM  Result Value Ref Range   Microalb, Ur 1.0 0.0 - 1.9 mg/dL   Creatinine,U 22.5 mg/dL  Microalb Creat Ratio 12.8 0.0 - 30.0 mg/g  Vitamin D  1,25 dihydroxy     Status: Abnormal   Collection Time: 08/05/24  8:48 AM  Result Value Ref Range   Vitamin D  1, 25 (OH)2 Total 78 (H) 18 - 72 pg/mL   Vitamin D3 1, 25 (OH)2 78 pg/mL   Vitamin D2 1, 25 (OH)2 <8 pg/mL    Comment: (Note) Vitamin D3, 1,25(OH)2 indicates both endogenous  production and supplementation. Vitamin D2, 1,25(OH)2 is  an indicator of exogenous sources, such as diet or  supplementation. Interpretation and therapy are based on  measurement of Vitamin D , 1,25 (OH)2, Total. . This test was developed, and its analytical performance  characteristics have been determined by [employer]. It has not been cleared or  approved by the  FDA. This assay has been validated pursuant to the CLIA  regulations and is used for clinical purposes. . For additional information, please refer to http://education.[facility].com/faq/FAQ199 (This link is being provided for  informational/educational purposes only.) . [facility] [address] [place] [address] [phone] Johanna Agent L. Gino, MD, PhD   CBC with Differential/Platelet     Status: None   Collection Time: 08/05/24  8:48 AM  Result Value Ref Range   WBC 5.0 4.0 - 10.5 K/uL   RBC 4.59 3.87 - 5.11 Mil/uL   Hemoglobin 12.9 12.0 - 15.0 g/dL   HCT 61.1 63.9 - 53.9 %   MCV 84.5 78.0 - 100.0 fl   MCHC 33.2 30.0 - 36.0 g/dL   RDW 86.1 88.4 - 84.4 %   Platelets 292.0 150.0 - 400.0 K/uL   Neutrophils Relative % 56.9 43.0 - 77.0 %   Lymphocytes Relative 34.4 12.0 - 46.0 %   Monocytes Relative 6.4 3.0 - 12.0 %   Eosinophils Relative 1.6 0.0 - 5.0 %   Basophils Relative 0.7 0.0 - 3.0 %   Neutro Abs 2.9 1.4 - 7.7 K/uL   Lymphs Abs 1.7 0.7 - 4.0 K/uL   Monocytes Absolute 0.3 0.1 - 1.0 K/uL   Eosinophils Absolute 0.1 0.0 - 0.7 K/uL   Basophils Absolute 0.0 0.0 - 0.1 K/uL  Comprehensive metabolic panel with GFR     Status: None   Collection Time: 08/05/24  8:48 AM  Result Value Ref Range   Sodium 139 135 - 145 mEq/L   Potassium 3.9 3.5 - 5.1 mEq/L   Chloride 106 96 - 112 mEq/L   CO2 26 19 - 32 mEq/L   Glucose, Bld 87 70 - 99 mg/dL   BUN 9 6 - 23 mg/dL   Creatinine, Ser 9.26 0.40 - 1.20 mg/dL   Total Bilirubin 0.5 0.2 - 1.2 mg/dL   Alkaline Phosphatase 90 39 - 117 U/L   AST 17 0 - 37 U/L   ALT 15 0 - 35 U/L   Total Protein 8.0 6.0 - 8.3 g/dL   Albumin 4.1 3.5 - 5.2 g/dL   GFR 08.99 >39.99 mL/min    Comment: Calculated using the CKD-EPI Creatinine Equation (2021)   Calcium  9.4 8.4 - 10.5 mg/dL  Urinalysis w microscopic + reflex cultur     Status: Abnormal   Collection Time: 08/05/24  8:48 AM   Specimen:  Blood  Result Value Ref Range   Color, Urine YELLOW YELLOW   APPearance CLOUDY (A) CLEAR   Specific Gravity, Urine 1.011 1.001 - 1.035   pH 6.5 5.0 - 8.0   Glucose, UA NEGATIVE NEGATIVE   Bilirubin  Urine NEGATIVE NEGATIVE   Ketones, ur NEGATIVE NEGATIVE   Hgb urine dipstick NEGATIVE NEGATIVE   Protein, ur NEGATIVE NEGATIVE   Nitrites, Initial NEGATIVE NEGATIVE   Leukocyte Esterase NEGATIVE NEGATIVE   WBC, UA 0-5 0 - 5 /HPF   RBC / HPF 0-2 0 - 2 /HPF   Squamous Epithelial / HPF 0-5 < OR = 5 /HPF   Bacteria, UA FEW (A) NONE SEEN /HPF   Calcium  Oxalate Crystal MODERATE (A) NONE OR FEW /HPF   Hyaline Cast NONE SEEN NONE SEEN /LPF   Note      Comment: This urine was analyzed for the presence of WBC,  RBC, bacteria, casts, and other formed elements.  Only those elements seen were reported. . .   Sedimentation rate     Status: None   Collection Time: 08/05/24  8:48 AM  Result Value Ref Range   Sed Rate 20 0 - 30 mm/hr  REFLEXIVE URINE CULTURE     Status: None   Collection Time: 08/05/24  8:48 AM  Result Value Ref Range   Reflexve Urine Culture      Comment: NO CULTURE INDICATED        Beverley Adine Hummer, MD, MS

## 2024-08-31 ENCOUNTER — Other Ambulatory Visit (HOSPITAL_COMMUNITY): Payer: Self-pay

## 2024-08-31 ENCOUNTER — Telehealth: Payer: Self-pay

## 2024-08-31 NOTE — Telephone Encounter (Signed)
 Pharmacy Patient Advocate Encounter   Received notification from RX Request Messages that prior authorization for Nurtec 75MG  dispersible tablets  is required/requested.   Insurance verification completed.   The patient is insured [facility].   Per test claim: PA required; PA submitted to above mentioned insurance via Latent Key/confirmation #/EOC [facility] Status is pending

## 2024-09-01 ENCOUNTER — Other Ambulatory Visit (HOSPITAL_COMMUNITY): Payer: Self-pay

## 2024-09-01 NOTE — Telephone Encounter (Signed)
 Noted

## 2024-09-01 NOTE — Telephone Encounter (Signed)
 tient Advocate Encounter  Received notification from [facility] that Prior Authorization for Nurtec 75MG  dispersible tablets has been APPROVED to 10.8.26. Ran test claim, Copay is $0.00. This test claim was processed through [facility]- copay amounts may vary at other pharmacies due to pharmacy/plan contracts, or as the patient moves through the different stages of their insurance plan.

## 2024-09-01 NOTE — Telephone Encounter (Signed)
 Pharmacy Patient Advocate Encounter  Received notification from [facility] that Prior Authorization for Nurtec 75MG  dispersible tablets has been APPROVED to 10.8.26. Ran test claim, Copay is $0.00. This test claim was processed through [facility]- copay amounts may vary at other pharmacies due to pharmacy/plan contracts, or as the patient moves through the different stages of their insurance plan.

## 2024-09-02 LAB — VITAMIN D 1,25 DIHYDROXY
Vitamin D 1, 25 (OH)2 Total: 67 pg/mL (ref 18–72)
Vitamin D2 1, 25 (OH)2: <8 pg/mL
Vitamin D3 1, 25 (OH)2: 67 pg/mL

## 2024-09-03 ENCOUNTER — Other Ambulatory Visit: Payer: Self-pay | Admitting: Family Medicine

## 2024-09-03 ENCOUNTER — Ambulatory Visit: Payer: Self-pay | Admitting: Family Medicine

## 2024-09-03 DIAGNOSIS — I10 Essential (primary) hypertension: Secondary | ICD-10-CM

## 2024-09-06 ENCOUNTER — Other Ambulatory Visit (HOSPITAL_COMMUNITY): Payer: Self-pay

## 2024-09-06 NOTE — Telephone Encounter (Signed)
 Called Pt to see if she was able to pick up Rx from the pharmacy; left VM for call back and sent MyChart message.

## 2024-09-07 ENCOUNTER — Other Ambulatory Visit (HOSPITAL_COMMUNITY): Payer: Self-pay

## 2024-09-07 ENCOUNTER — Telehealth: Payer: Self-pay

## 2024-09-07 NOTE — Telephone Encounter (Signed)
 Noted. Rx is currently being filled and Pt will be called once ready by pharmacy.

## 2024-09-07 NOTE — Telephone Encounter (Signed)
 REQUEST: Please note that a request was made for nurtec and completed for nurtec and approved. However the pt seem to have problems filling the rx. I have called the pts pref'd pharmacy over 1.5 days and was able to fill the Rx as of today with no issue.

## 2024-12-02 ENCOUNTER — Ambulatory Visit: Admitting: Family Medicine

## 2024-12-28 ENCOUNTER — Ambulatory Visit: Admitting: Family Medicine

## 2025-01-20 ENCOUNTER — Ambulatory Visit: Admitting: Family Medicine

## 2025-02-20 ENCOUNTER — Ambulatory Visit: Admitting: Internal Medicine
